# Patient Record
Sex: Female | Born: 1975
Health system: Southern US, Community
[De-identification: ages and names within clinical notes are randomized; demographics above are authoritative.]

## PROBLEM LIST (undated history)

## (undated) DIAGNOSIS — K219 Gastro-esophageal reflux disease without esophagitis: Secondary | ICD-10-CM

## (undated) DIAGNOSIS — R748 Abnormal levels of other serum enzymes: Secondary | ICD-10-CM

## (undated) DIAGNOSIS — M549 Dorsalgia, unspecified: Secondary | ICD-10-CM

## (undated) DIAGNOSIS — J9601 Acute respiratory failure with hypoxia: Secondary | ICD-10-CM

## (undated) DIAGNOSIS — D649 Anemia, unspecified: Secondary | ICD-10-CM

## (undated) DIAGNOSIS — F419 Anxiety disorder, unspecified: Secondary | ICD-10-CM

## (undated) DIAGNOSIS — E559 Vitamin D deficiency, unspecified: Secondary | ICD-10-CM

## (undated) HISTORY — DX: Dorsalgia, unspecified: M54.9

## (undated) HISTORY — DX: Abnormal levels of other serum enzymes: R74.8

## (undated) HISTORY — DX: Acute respiratory failure with hypoxia: J96.01

## (undated) HISTORY — DX: Vitamin D deficiency, unspecified: E55.9

## (undated) HISTORY — DX: Gastro-esophageal reflux disease without esophagitis: K21.9

## (undated) HISTORY — DX: Anemia, unspecified: D64.9

## (undated) HISTORY — PX: TONSILLECTOMY: SUR1361

## (undated) HISTORY — DX: Anxiety disorder, unspecified: F41.9

---

## 2002-02-18 ENCOUNTER — Encounter: Payer: Self-pay | Admitting: Emergency Medicine

## 2002-02-18 ENCOUNTER — Emergency Department (HOSPITAL_COMMUNITY): Admission: EM | Admit: 2002-02-18 | Discharge: 2002-02-18 | Payer: Self-pay | Admitting: Emergency Medicine

## 2002-06-18 ENCOUNTER — Other Ambulatory Visit: Admission: RE | Admit: 2002-06-18 | Discharge: 2002-06-18 | Payer: Self-pay | Admitting: *Deleted

## 2002-11-02 ENCOUNTER — Encounter (HOSPITAL_COMMUNITY): Admission: RE | Admit: 2002-11-02 | Discharge: 2002-11-02 | Payer: Self-pay | Admitting: *Deleted

## 2002-11-07 ENCOUNTER — Inpatient Hospital Stay (HOSPITAL_COMMUNITY): Admission: AD | Admit: 2002-11-07 | Discharge: 2002-11-12 | Payer: Self-pay | Admitting: *Deleted

## 2002-11-07 ENCOUNTER — Encounter: Payer: Self-pay | Admitting: *Deleted

## 2002-11-08 ENCOUNTER — Encounter: Payer: Self-pay | Admitting: *Deleted

## 2002-11-13 ENCOUNTER — Encounter: Admission: RE | Admit: 2002-11-13 | Discharge: 2002-12-13 | Payer: Self-pay | Admitting: *Deleted

## 2002-12-14 ENCOUNTER — Encounter: Admission: RE | Admit: 2002-12-14 | Discharge: 2003-01-13 | Payer: Self-pay | Admitting: *Deleted

## 2002-12-31 ENCOUNTER — Other Ambulatory Visit: Admission: RE | Admit: 2002-12-31 | Discharge: 2002-12-31 | Payer: Self-pay | Admitting: *Deleted

## 2005-02-11 ENCOUNTER — Emergency Department (HOSPITAL_COMMUNITY): Admission: EM | Admit: 2005-02-11 | Discharge: 2005-02-12 | Payer: Self-pay | Admitting: Emergency Medicine

## 2005-11-27 ENCOUNTER — Other Ambulatory Visit: Admission: RE | Admit: 2005-11-27 | Discharge: 2005-11-27 | Payer: Self-pay | Admitting: Gynecology

## 2005-12-16 ENCOUNTER — Emergency Department (HOSPITAL_COMMUNITY): Admission: EM | Admit: 2005-12-16 | Discharge: 2005-12-16 | Payer: Self-pay | Admitting: Family Medicine

## 2006-03-13 ENCOUNTER — Inpatient Hospital Stay (HOSPITAL_COMMUNITY): Admission: AD | Admit: 2006-03-13 | Discharge: 2006-03-16 | Payer: Self-pay | Admitting: Gynecology

## 2006-05-22 ENCOUNTER — Other Ambulatory Visit: Admission: RE | Admit: 2006-05-22 | Discharge: 2006-05-22 | Payer: Self-pay | Admitting: Gynecology

## 2012-09-22 ENCOUNTER — Other Ambulatory Visit: Payer: Self-pay | Admitting: Family Medicine

## 2012-09-24 NOTE — Telephone Encounter (Signed)
Opened in error

## 2014-03-22 DIAGNOSIS — H5210 Myopia, unspecified eye: Secondary | ICD-10-CM | POA: Insufficient documentation

## 2016-03-20 DIAGNOSIS — Z124 Encounter for screening for malignant neoplasm of cervix: Secondary | ICD-10-CM | POA: Diagnosis not present

## 2016-03-20 DIAGNOSIS — Z01419 Encounter for gynecological examination (general) (routine) without abnormal findings: Secondary | ICD-10-CM | POA: Diagnosis not present

## 2016-03-20 DIAGNOSIS — Z1151 Encounter for screening for human papillomavirus (HPV): Secondary | ICD-10-CM | POA: Diagnosis not present

## 2017-03-21 DIAGNOSIS — Z124 Encounter for screening for malignant neoplasm of cervix: Secondary | ICD-10-CM | POA: Diagnosis not present

## 2017-03-21 DIAGNOSIS — Z01419 Encounter for gynecological examination (general) (routine) without abnormal findings: Secondary | ICD-10-CM | POA: Diagnosis not present

## 2017-03-21 DIAGNOSIS — Z0000 Encounter for general adult medical examination without abnormal findings: Secondary | ICD-10-CM | POA: Diagnosis not present

## 2017-07-19 DIAGNOSIS — H5213 Myopia, bilateral: Secondary | ICD-10-CM | POA: Diagnosis not present

## 2017-11-27 ENCOUNTER — Encounter (INDEPENDENT_AMBULATORY_CARE_PROVIDER_SITE_OTHER): Payer: Self-pay

## 2017-12-03 ENCOUNTER — Encounter (INDEPENDENT_AMBULATORY_CARE_PROVIDER_SITE_OTHER): Payer: Self-pay | Admitting: Family Medicine

## 2017-12-03 ENCOUNTER — Ambulatory Visit (INDEPENDENT_AMBULATORY_CARE_PROVIDER_SITE_OTHER): Payer: 59 | Admitting: Family Medicine

## 2017-12-03 VITALS — BP 143/93 | HR 63 | Temp 98.1°F | Ht 65.0 in | Wt 242.0 lb

## 2017-12-03 DIAGNOSIS — Z6841 Body Mass Index (BMI) 40.0 and over, adult: Secondary | ICD-10-CM

## 2017-12-03 DIAGNOSIS — R5383 Other fatigue: Secondary | ICD-10-CM | POA: Diagnosis not present

## 2017-12-03 DIAGNOSIS — Z9189 Other specified personal risk factors, not elsewhere classified: Secondary | ICD-10-CM

## 2017-12-03 DIAGNOSIS — Z1331 Encounter for screening for depression: Secondary | ICD-10-CM

## 2017-12-03 DIAGNOSIS — R0602 Shortness of breath: Secondary | ICD-10-CM | POA: Insufficient documentation

## 2017-12-03 DIAGNOSIS — E559 Vitamin D deficiency, unspecified: Secondary | ICD-10-CM

## 2017-12-03 DIAGNOSIS — Z0289 Encounter for other administrative examinations: Secondary | ICD-10-CM

## 2017-12-03 DIAGNOSIS — R030 Elevated blood-pressure reading, without diagnosis of hypertension: Secondary | ICD-10-CM | POA: Diagnosis not present

## 2017-12-03 HISTORY — DX: Vitamin D deficiency, unspecified: E55.9

## 2017-12-03 NOTE — Progress Notes (Signed)
.  Office: [phone]  /  Fax: [phone]   HPI:   Chief Complaint: OBESITY  Julia Ibarra (MR# [id number]) is a 42 y.o. female who presents on 12/03/2017 for obesity evaluation and treatment. Current BMI is Body mass index is 40.27 kg/m.Marland Kitchen Shawnna has struggled with obesity for years and has been unsuccessful in either losing weight or maintaining long term weight loss. The wife of one of the director's at work, told Julia Ibarra about our program. Julia Ibarra doesn't feel she eats enough and she doesn't like traditional breakfast food. Julia Ibarra was previously on phentermine per [facility] notes in 2016. Julia Ibarra attended our information session and states she is currently in the action stage of change and ready to dedicate time achieving and maintaining a healthier weight.  Julia Ibarra states her family eats meals together she thinks her family will eat healthier with  her her desired weight loss is 72.3 lbs she has been heavy most of  her life she started gaining weight at 42 yrs old her heaviest weight ever was 249 lbs. she is a picky eater and doesn't like to eat healthier foods  she has significant food cravings issues  she snacks frequently in the evenings she skips meals frequently she is frequently drinking liquids with calories she has problems with excessive hunger  she frequently eats larger portions than normal  she struggles with emotional eating    Fatigue Julia Ibarra feels her energy is lower than it should be. This has worsened with weight gain and has not worsened recently. Julia Ibarra admits to daytime somnolence and admits to waking up still tired. Patient is at risk for obstructive sleep apnea. Patent has a history of symptoms of daytime fatigue, morning fatigue and morning headache. Patient generally gets 6 hours of sleep per night, and states they generally have restful sleep. Snoring is present. Apneic episodes are not present. Epworth Sleepiness Score is 5 Her EKG was within normal  limits.  Dyspnea on exertion Julia Ibarra notes increasing shortness of breath with exercising and seems to be worsening over time with weight gain. She notes getting out of breath sooner with activity than she used to. This has not gotten worse recently. Julia Ibarra wakes up tired, and she snores (snores loud). Julia Ibarra is to monitor the number of nighttime awakenings. She may need sleep study. Julia Ibarra denies orthopnea.  Vitamin D deficiency Julia Ibarra has a likely diagnosis of vitamin D deficiency given obesity. She is not currently taking vit D and denies nausea, vomiting or muscle weakness.  At risk for osteopenia and osteoporosis Julia Ibarra is at higher risk of osteopenia and osteoporosis due to vitamin D deficiency.   Elevated Blood Pressure Julia Ibarra has elevated blood pressure today at 143/93 without diagnosis of hypertension. She denies chest pain, chest pressure or headache.  Depression Screen Julia Ibarra's Food and Mood (modified PHQ-9) score was  Depression screen PHQ 2/9 12/03/2017  Decreased Interest 1  Down, Depressed, Hopeless 1  PHQ - 2 Score 2  Altered sleeping 0  Tired, decreased energy 3  Change in appetite 1  Feeling bad or failure about yourself  1  Trouble concentrating 0  Moving slowly or fidgety/restless 0  Suicidal thoughts 0  PHQ-9 Score 7  Difficult doing work/chores Not difficult at all    ALLERGIES: Not on File  MEDICATIONS: Current Outpatient Medications on File Prior to Visit  Medication Sig Dispense Refill  . levonorgestrel-ethinyl estradiol (NORDETTE) 0.15-30 MG-MCG tablet Take 1 tablet by mouth daily.     No current facility-administered medications on  file prior to visit.     PAST MEDICAL HISTORY: History reviewed. No pertinent past medical history.  PAST SURGICAL HISTORY: Past Surgical History:  Procedure Laterality Date  . TONSILLECTOMY      SOCIAL HISTORY: Social History   Tobacco Use  . Smoking status: Never Smoker  . Smokeless tobacco: Never  Used  Substance Use Topics  . Alcohol use: Yes    Alcohol/week: 0.6 - 1.2 oz    Types: 1 - 2 Glasses of wine per week    Comment: On the weekends  . Drug use: No    FAMILY HISTORY: Family History  Problem Relation Age of Onset  . Depression Mother   . Anxiety disorder Mother   . Bipolar disorder Mother   . Alcoholism Mother     ROS: Review of Systems  Constitutional: Positive for malaise/fatigue.  Eyes:       Wear Glasses or Contacts  Respiratory: Positive for shortness of breath (on exertion).   Cardiovascular: Negative for chest pain and orthopnea.       Negative for chest pressure  Gastrointestinal: Negative for nausea and vomiting.  Musculoskeletal: Positive for back pain.       Negative muscle weakness  Neurological: Negative for headaches.    PHYSICAL EXAM: Blood pressure (!) 143/93, pulse 63, temperature 98.1 F (36.7 C), temperature source Oral, height 5\' 5"  (1.651 m), weight 242 lb (109.8 kg), last menstrual period 11/22/2017, SpO2 100 %. Body mass index is 40.27 kg/m. Physical Exam  Constitutional: She is oriented to person, place, and time. She appears well-developed and well-nourished.  HENT:  Head: Normocephalic and atraumatic.  Nose: Nose normal.  Eyes: EOM are normal. No scleral icterus.  Neck: Normal range of motion. Neck supple. No thyromegaly present.  Cardiovascular: Normal rate and regular rhythm.  Pulmonary/Chest: Effort normal. No respiratory distress.  Abdominal: Soft. There is no tenderness.  + obesity  Musculoskeletal: Normal range of motion.  Range of Motion normal in all 4 extremities  Neurological: She is alert and oriented to person, place, and time. Coordination normal.  Skin: Skin is warm and dry.  Psychiatric: She has a normal mood and affect. Her behavior is normal.  Vitals reviewed.   RECENT LABS AND TESTS: BMET No results found for: NA, K, CL, CO2, GLUCOSE, BUN, CREATININE, CALCIUM, GFRNONAA, GFRAA No results found for:  HGBA1C No results found for: INSULIN CBC No results found for: WBC, RBC, HGB, HCT, PLT, MCV, MCH, MCHC, RDW, LYMPHSABS, MONOABS, EOSABS, BASOSABS Iron/TIBC/Ferritin/ %Sat No results found for: IRON, TIBC, FERRITIN, IRONPCTSAT Lipid Panel  No results found for: CHOL, TRIG, HDL, CHOLHDL, VLDL, LDLCALC, LDLDIRECT Hepatic Function Panel  No results found for: PROT, ALBUMIN, AST, ALT, ALKPHOS, BILITOT, BILIDIR, IBILI No results found for: TSH Vitamin D There are no recent results  ECG  shows NSR with a rate of 62 BPM INDIRECT CALORIMETER done today shows a VO2 of 325 and a REE of 2263. Her calculated basal metabolic rate is 4098 thus her basal metabolic rate is better than expected.    ASSESSMENT AND PLAN: Other fatigue - Plan: EKG 12-Lead, Vitamin B12, CBC With Differential, Comprehensive metabolic panel, Folate, Hemoglobin A1c, Insulin, random, Lipid Panel With LDL/HDL Ratio, T3, T4, free, TSH  Shortness of breath on exertion - Plan: CBC With Differential  Elevated blood pressure reading - Plan: Comprehensive metabolic panel  Vitamin D deficiency - Plan: VITAMIN D 25 Hydroxy (Vit-D Deficiency, Fractures)  Depression screening  At risk for osteoporosis  Class 3  severe obesity with serious comorbidity and body mass index (BMI) of 40.0 to 44.9 in adult, unspecified obesity type Julia Ibarra)  PLAN:  Fatigue Julia Ibarra was informed that her fatigue may be related to obesity, depression or many other causes. Labs will be ordered, and in the meanwhile Julia Ibarra has agreed to work on diet, exercise and weight loss to help with fatigue. Proper sleep hygiene was discussed including the need for 7-8 hours of quality sleep each night. A sleep study was not ordered based on symptoms and Epworth score. We will order EKG and indirect calorimetry and Julia Ibarra agrees to follow up with our clinic in 2 weeks.  Dyspnea on exertion Julia Ibarra's shortness of breath appears to be obesity related and exercise  induced. She has agreed to work on weight loss and gradually increase exercise to treat her exercise induced shortness of breath. If Julia Ibarra follows our instructions and loses weight without improvement of her shortness of breath, we will plan to refer to pulmonology. We will order EKG and indirect calorimetry and will monitor this condition regularly. Julia Ibarra agrees to this plan.  Vitamin D Deficiency Julia Ibarra was informed that low vitamin D levels contributes to fatigue and are associated with obesity, breast, and colon cancer. We will check labs and will follow up for routine testing of vitamin D, at least 2-3 times per year.   At risk for osteopenia and osteoporosis Julia Ibarra is at risk for osteopenia and osteoporosis due to her vitamin D deficiency. She was encouraged to follow her higher calcium diet and increase strengthening exercise to help strengthen her bones and decrease her risk of osteopenia and osteoporosis.  Elevated Blood Pressure We will monitor blood pressure at her next visit in 2 weeks.  Depression Screen Julia Ibarra had a mildly positive depression screening. Depression is commonly associated with obesity and often results in emotional eating behaviors. We will monitor this closely and work on CBT to help improve the non-hunger eating patterns. Referral to Psychology may be required if no improvement is seen as she continues in our clinic.  Obesity Julia Ibarra is currently in the action stage of change and her goal is to continue with weight loss efforts She has agreed to follow the Category 3 plan +200 calories Julia Ibarra has been instructed to work up to a goal of 150 minutes of combined cardio and strengthening exercise per week for weight loss and overall health benefits. We discussed the following Behavioral Modification Strategies today: increase H2O intake, increasing lean protein intake, decrease eating out and dealing with family or coworker sabotage  Lucrezia has agreed to  follow up with our clinic in 2 weeks. She was informed of the importance of frequent follow up visits to maximize her success with intensive lifestyle modifications for her multiple health conditions. She was informed we would discuss her lab results at her next visit unless there is a critical issue that needs to be addressed sooner. Sanaya agreed to keep her next visit at the agreed upon time to discuss these results.    OBESITY BEHAVIORAL INTERVENTION VISIT  Today's visit was # 1 out of 22.  Starting weight: 242 lbs Starting date: 12/03/17 Today's weight : 242 lbs Today's date: 12/03/2017 Total lbs lost to date: 0 (Patients must lose 7 lbs in the first 6 months to continue with counseling)   ASK: We discussed the diagnosis of obesity with Wonda Olds today and Maybelle agreed to give [place] permission to discuss obesity behavioral modification therapy today.  ASSESS: Novie has the  diagnosis of obesity and her BMI today is 40.27 [place] is in the action stage of change   ADVISE: Dalores was educated on the multiple health risks of obesity as well as the benefit of weight loss to improve her health. She was advised of the need for long term treatment and the importance of lifestyle modifications.  AGREE: Multiple dietary modification options and treatment options were discussed and  Bobbette agreed to the above obesity treatment plan.   I, Doreene Nest, am acting as transcriptionist for Eber Jones, MD   I have reviewed the above documentation for accuracy and completeness, and I agree with the above. - Ilene Qua, MD

## 2017-12-04 LAB — CBC WITH DIFFERENTIAL
Basophils Absolute: 0 10*3/uL (ref 0.0–0.2)
Basos: 0 %
EOS (ABSOLUTE): 0.1 10*3/uL (ref 0.0–0.4)
EOS: 2 %
HEMATOCRIT: 37.3 % (ref 34.0–46.6)
HEMOGLOBIN: 11.7 g/dL (ref 11.1–15.9)
IMMATURE GRANS (ABS): 0 10*3/uL (ref 0.0–0.1)
IMMATURE GRANULOCYTES: 0 %
LYMPHS ABS: 2.4 10*3/uL (ref 0.7–3.1)
LYMPHS: 37 %
MCH: 28.1 pg (ref 26.6–33.0)
MCHC: 31.4 g/dL — ABNORMAL LOW (ref 31.5–35.7)
MCV: 90 fL (ref 79–97)
MONOCYTES: 6 %
Monocytes Absolute: 0.4 10*3/uL (ref 0.1–0.9)
Neutrophils Absolute: 3.7 10*3/uL (ref 1.4–7.0)
Neutrophils: 55 %
RBC: 4.16 x10E6/uL (ref 3.77–5.28)
RDW: 13.9 % (ref 12.3–15.4)
WBC: 6.7 10*3/uL (ref 3.4–10.8)

## 2017-12-04 LAB — VITAMIN D 25 HYDROXY (VIT D DEFICIENCY, FRACTURES): Vit D, 25-Hydroxy: 5.8 ng/mL — ABNORMAL LOW (ref 30.0–100.0)

## 2017-12-04 LAB — LIPID PANEL WITH LDL/HDL RATIO
CHOLESTEROL TOTAL: 166 mg/dL (ref 100–199)
HDL: 73 mg/dL (ref >39)
LDL CALC: 78 mg/dL (ref 0–99)
LDl/HDL Ratio: 1.1 ratio (ref 0.0–3.2)
TRIGLYCERIDES: 75 mg/dL (ref 0–149)
VLDL CHOLESTEROL CAL: 15 mg/dL (ref 5–40)

## 2017-12-04 LAB — COMPREHENSIVE METABOLIC PANEL
ALBUMIN: 4.2 g/dL (ref 3.5–5.5)
ALT: 7 IU/L (ref 0–32)
AST: 9 IU/L (ref 0–40)
Albumin/Globulin Ratio: 1.7 (ref 1.2–2.2)
Alkaline Phosphatase: 58 IU/L (ref 39–117)
BUN/Creatinine Ratio: 13 (ref 9–23)
BUN: 10 mg/dL (ref 6–20)
Bilirubin Total: 0.3 mg/dL (ref 0.0–1.2)
CALCIUM: 9.6 mg/dL (ref 8.7–10.2)
CO2: 21 mmol/L (ref 20–29)
CREATININE: 0.77 mg/dL (ref 0.57–1.00)
Chloride: 107 mmol/L — ABNORMAL HIGH (ref 96–106)
GFR calc Af Amer: 112 mL/min/{1.73_m2} (ref >59)
GFR, EST NON AFRICAN AMERICAN: 98 mL/min/{1.73_m2} (ref >59)
GLOBULIN, TOTAL: 2.5 g/dL (ref 1.5–4.5)
Glucose: 98 mg/dL (ref 65–99)
Potassium: 4.2 mmol/L (ref 3.5–5.2)
SODIUM: 140 mmol/L (ref 134–144)
TOTAL PROTEIN: 6.7 g/dL (ref 6.0–8.5)

## 2017-12-04 LAB — TSH: TSH: 1.53 u[IU]/mL (ref 0.450–4.500)

## 2017-12-04 LAB — VITAMIN B12

## 2017-12-04 LAB — HEMOGLOBIN A1C
Est. average glucose Bld gHb Est-mCnc: 103 mg/dL
Hgb A1c MFr Bld: 5.2 % (ref 4.8–5.6)

## 2017-12-04 LAB — INSULIN, RANDOM: INSULIN: 7.6 u[IU]/mL (ref 2.6–24.9)

## 2017-12-04 LAB — T3: T3, Total: 112 ng/dL (ref 71–180)

## 2017-12-04 LAB — T4, FREE: Free T4: 1.21 ng/dL (ref 0.82–1.77)

## 2017-12-04 LAB — FOLATE: Folate: 7 ng/mL (ref >3.0)

## 2017-12-17 ENCOUNTER — Ambulatory Visit (INDEPENDENT_AMBULATORY_CARE_PROVIDER_SITE_OTHER): Payer: 59 | Admitting: Family Medicine

## 2017-12-17 VITALS — BP 124/84 | HR 66 | Temp 98.0°F | Ht 65.0 in | Wt 238.0 lb

## 2017-12-17 DIAGNOSIS — E538 Deficiency of other specified B group vitamins: Secondary | ICD-10-CM | POA: Diagnosis not present

## 2017-12-17 DIAGNOSIS — Z9189 Other specified personal risk factors, not elsewhere classified: Secondary | ICD-10-CM | POA: Diagnosis not present

## 2017-12-17 DIAGNOSIS — Z6839 Body mass index (BMI) 39.0-39.9, adult: Secondary | ICD-10-CM | POA: Diagnosis not present

## 2017-12-17 DIAGNOSIS — E559 Vitamin D deficiency, unspecified: Secondary | ICD-10-CM

## 2017-12-17 MED ORDER — VITAMIN D (ERGOCALCIFEROL) 1.25 MG (50000 UNIT) PO CAPS: 50000.0000 [IU] | ORAL_CAPSULE | ORAL | 0 refills | Status: DC

## 2017-12-17 MED ORDER — VITAMIN B-12 1000 MCG PO TABS: 2000.0000 ug | ORAL_TABLET | Freq: Every day | ORAL | 0 refills | Status: DC

## 2017-12-17 NOTE — Progress Notes (Signed)
Office: [phone]  /  Fax: [phone]   HPI:   Chief Complaint: OBESITY Julia Ibarra is here to discuss her progress with her obesity treatment plan. She is on the Category 3 plan + 200 calories and is following her eating plan approximately 95 % of the time. She states she is doing cardio and strength for 60 minutes 3-4 times per week. Julia Ibarra brought lunch except a few days, able to eat most of meal plan including protein at dinner. She finds bread to not taste like much and was not getting all snack calories in.  Her weight is 238 lb (108 kg) today and has had a weight loss of 4 pounds over a period of 2 weeks since her last visit. She has lost 4 lbs since starting treatment with [place].  Vitamin D Deficiency Julia Ibarra has a diagnosis of vitamin D deficiency. She is not on any OTC supplement currently and she denies nausea, vomiting or muscle weakness.  Vitamin B12 Deficiency Julia Ibarra has a diagnosis of B12 insufficiency. She has never tested before, B12 <150 but folate normal. She is not a vegetarian and does not have a previous diagnosis of pernicious anemia. She does not have a history of weight loss surgery.   ALLERGIES: Not on File  MEDICATIONS: Current Outpatient Medications on File Prior to Visit  Medication Sig Dispense Refill  . levonorgestrel-ethinyl estradiol (NORDETTE) 0.15-30 MG-MCG tablet Take 1 tablet by mouth daily.     No current facility-administered medications on file prior to visit.     PAST MEDICAL HISTORY: No past medical history on file.  PAST SURGICAL HISTORY: Past Surgical History:  Procedure Laterality Date  . TONSILLECTOMY      SOCIAL HISTORY: Social History   Tobacco Use  . Smoking status: Never Smoker  . Smokeless tobacco: Never Used  Substance Use Topics  . Alcohol use: Yes    Alcohol/week: 0.6 - 1.2 oz    Types: 1 - 2 Glasses of wine per week    Comment: On the weekends  . Drug use: No    FAMILY HISTORY: Family History  Problem  Relation Age of Onset  . Depression Mother   . Anxiety disorder Mother   . Bipolar disorder Mother   . Alcoholism Mother     ROS: Review of Systems  Constitutional: Positive for weight loss.  Gastrointestinal: Negative for nausea and vomiting.  Musculoskeletal:       Negative muscle weakness    PHYSICAL EXAM: Blood pressure 124/84, pulse 66, temperature 98 F (36.7 C), temperature source Oral, height 5\' 5"  (1.651 m), weight 238 lb (108 kg), last menstrual period 11/22/2017, SpO2 100 %. Body mass index is 39.61 kg/m. Physical Exam  Constitutional: She is oriented to person, place, and time. She appears well-developed and well-nourished.  Cardiovascular: Normal rate.  Pulmonary/Chest: Effort normal.  Musculoskeletal: Normal range of motion.  Neurological: She is oriented to person, place, and time.  Skin: Skin is warm and dry.  Psychiatric: She has a normal mood and affect. Her behavior is normal.  Vitals reviewed.   RECENT LABS AND TESTS: BMET    Component Value Date/Time   NA 140 12/03/2017 1009   K 4.2 12/03/2017 1009   CL 107 (H) 12/03/2017 1009   CO2 21 12/03/2017 1009   GLUCOSE 98 12/03/2017 1009   BUN 10 12/03/2017 1009   CREATININE 0.77 12/03/2017 1009   CALCIUM 9.6 12/03/2017 1009   GFRNONAA 98 12/03/2017 1009   GFRAA 112 12/03/2017 1009  Lab Results  Component Value Date   HGBA1C 5.2 12/03/2017   Lab Results  Component Value Date   INSULIN 7.6 12/03/2017   CBC    Component Value Date/Time   WBC 6.7 12/03/2017 1009   RBC 4.16 12/03/2017 1009   HGB 11.7 12/03/2017 1009   HCT 37.3 12/03/2017 1009   MCV 90 12/03/2017 1009   MCH 28.1 12/03/2017 1009   MCHC 31.4 (L) 12/03/2017 1009   RDW 13.9 12/03/2017 1009   LYMPHSABS 2.4 12/03/2017 1009   EOSABS 0.1 12/03/2017 1009   BASOSABS 0.0 12/03/2017 1009   Iron/TIBC/Ferritin/ %Sat No results found for: IRON, TIBC, FERRITIN, IRONPCTSAT Lipid Panel     Component Value Date/Time   CHOL 166  12/03/2017 1009   TRIG 75 12/03/2017 1009   HDL 73 12/03/2017 1009   LDLCALC 78 12/03/2017 1009   Hepatic Function Panel     Component Value Date/Time   PROT 6.7 12/03/2017 1009   ALBUMIN 4.2 12/03/2017 1009   AST 9 12/03/2017 1009   ALT 7 12/03/2017 1009   ALKPHOS 58 12/03/2017 1009   BILITOT 0.3 12/03/2017 1009      Component Value Date/Time   TSH 1.530 12/03/2017 1009  Results for RUTA, CAPECE (MRN [id number]) as of 12/17/2017 14:28  Ref. Range 12/03/2017 10:09  Vitamin D, 25-Hydroxy Latest Ref Range: 30.0 - 100.0 ng/mL 5.8 (L)    ASSESSMENT AND PLAN: Vitamin D deficiency - Plan: Vitamin D, Ergocalciferol, (DRISDOL) 50000 units CAPS capsule  Vitamin B12 deficiency - Plan: vitamin B-12 (CYANOCOBALAMIN) 1000 MCG tablet  At risk for osteopenia  Class 2 severe obesity with serious comorbidity and body mass index (BMI) of 39.0 to 39.9 in adult, unspecified obesity type (HCC)  PLAN:  Vitamin D Deficiency Julia Ibarra was informed that low vitamin D levels contributes to fatigue and are associated with obesity, breast, and colon cancer. Julia Ibarra agrees to start prescription Vit D @50 ,000 IU every week #4 with no refills and will follow up for routine testing of vitamin D, at least 2-3 times per year. She was informed of the risk of over-replacement of vitamin D and agrees to not increase her dose unless she discusses this with [place] first. Julia Ibarra agrees to follow up with our clinic in 2 weeks.  Vitamin B12 Deficiency Julia Ibarra will work on increasing B12 rich foods in her diet. Julia Ibarra agrees to start Vit B12 2,000 PO daily with no refills. We will recheck labs in 3 months and Julia Ibarra agrees to follow up with our clinic in 2 weeks.   At risk for osteopenia and osteoporosis Julia Ibarra is at risk for osteopenia and osteoporsis due to her vitamin D deficiency. She was encouraged to take her vitamin D and follow her higher calcium diet and increase strengthening exercise to help strengthen her  bones and decrease her risk of osteopenia and osteoporosis.  Obesity Julia Ibarra is currently in the action stage of change. As such, her goal is to continue with weight loss efforts She has agreed to follow the Category 3 plan + 200 calories Katora has been instructed to work up to a goal of 150 minutes of combined cardio and strengthening exercise per week for weight loss and overall health benefits. We discussed the following Behavioral Modification Strategies today: increasing lean protein intake, work on meal planning and easy cooking plans, increase H20 intake, and planning for success   Julia Ibarra has agreed to follow up with our clinic in 2 weeks. She was informed of the importance of  frequent follow up visits to maximize her success with intensive lifestyle modifications for her multiple health conditions.   OBESITY BEHAVIORAL INTERVENTION VISIT  Today's visit was # 2 out of 22.  Starting weight: 242 lbs Starting date: 12/03/17 Today's weight : 238 lbs  Today's date: 12/17/2017 Total lbs lost to date: 4 (Patients must lose 7 lbs in the first 6 months to continue with counseling)   ASK: We discussed the diagnosis of obesity with Julia Ibarra today and Julia Ibarra agreed to give [place] permission to discuss obesity behavioral modification therapy today.  ASSESS: Julia Ibarra has the diagnosis of obesity and her BMI today is 39.61 Julia Ibarra is in the action stage of change   ADVISE: Julia Ibarra was educated on the multiple health risks of obesity as well as the benefit of weight loss to improve her health. She was advised of the need for long term treatment and the importance of lifestyle modifications.  AGREE: Multiple dietary modification options and treatment options were discussed and  Julia Ibarra agreed to the above obesity treatment plan.  I, Trixie Dredge, am acting as transcriptionist for Ilene Qua, MD  I have reviewed the above documentation for accuracy and completeness, and I  agree with the above. - Ilene Qua, MD

## 2017-12-31 ENCOUNTER — Ambulatory Visit (INDEPENDENT_AMBULATORY_CARE_PROVIDER_SITE_OTHER): Payer: 59 | Admitting: Family Medicine

## 2017-12-31 VITALS — BP 123/78 | HR 74 | Temp 98.4°F | Ht 65.0 in | Wt 238.0 lb

## 2017-12-31 DIAGNOSIS — E538 Deficiency of other specified B group vitamins: Secondary | ICD-10-CM

## 2017-12-31 DIAGNOSIS — E559 Vitamin D deficiency, unspecified: Secondary | ICD-10-CM

## 2017-12-31 DIAGNOSIS — Z6839 Body mass index (BMI) 39.0-39.9, adult: Secondary | ICD-10-CM | POA: Diagnosis not present

## 2017-12-31 NOTE — Progress Notes (Signed)
Office: [phone]  /  Fax: [phone]   HPI:   Chief Complaint: OBESITY Julia Ibarra is here to discuss her progress with her obesity treatment plan. She is on the Category 3 plan +200 calories and is following her eating plan approximately 95 % of the time. She states she is doing cardio and strength training 30 to 60 minutes 3 times per week. Julia Ibarra is getting bored with meals on the category 3 plan and is looking for more options. Her weight is 238 lb (108 kg) today and has maintained weight over a period of 2 weeks since her last visit. She has lost 4 lbs since starting treatment with [place].  Vitamin D deficiency Julia Ibarra has a diagnosis of vitamin D deficiency. She is currently taking vit D and admits fatigue. Julia Ibarra denies nausea, vomiting or muscle weakness.   Ref. Range 12/03/2017 10:09  Vitamin D, 25-Hydroxy Latest Ref Range: 30.0 - 100.0 ng/mL 5.8 (L)   Vitamin B12 Deficiency Julia Ibarra has a diagnosis of B12 insufficiency and notes fatigue. This is not a new diagnosis. Julia Ibarra is currently taking OTC Vit B12 supplement. Julia Ibarra is not a vegetarian and does not have a previous diagnosis of pernicious anemia. She does not have a history of weight loss surgery.   ALLERGIES: Not on File  MEDICATIONS: Current Outpatient Medications on File Prior to Visit  Medication Sig Dispense Refill  . levonorgestrel-ethinyl estradiol (NORDETTE) 0.15-30 MG-MCG tablet Take 1 tablet by mouth daily.    . vitamin B-12 (CYANOCOBALAMIN) 1000 MCG tablet Take 2 tablets (2,000 mcg total) by mouth daily. 60 tablet 0  . Vitamin D, Ergocalciferol, (DRISDOL) 50000 units CAPS capsule Take 1 capsule (50,000 Units total) by mouth every 7 (seven) days. 4 capsule 0   No current facility-administered medications on file prior to visit.     PAST MEDICAL HISTORY: No past medical history on file.  PAST SURGICAL HISTORY: Past Surgical History:  Procedure Laterality Date  . TONSILLECTOMY      SOCIAL  HISTORY: Social History   Tobacco Use  . Smoking status: Never Smoker  . Smokeless tobacco: Never Used  Substance Use Topics  . Alcohol use: Yes    Alcohol/week: 0.6 - 1.2 oz    Types: 1 - 2 Glasses of wine per week    Comment: On the weekends  . Drug use: No    FAMILY HISTORY: Family History  Problem Relation Age of Onset  . Depression Mother   . Anxiety disorder Mother   . Bipolar disorder Mother   . Alcoholism Mother     ROS: Review of Systems  Constitutional: Positive for malaise/fatigue. Negative for weight loss.  Gastrointestinal: Negative for nausea and vomiting.  Musculoskeletal:       Negative for muscle weakness    PHYSICAL EXAM: Blood pressure 123/78, pulse 74, temperature 98.4 F (36.9 C), temperature source Oral, height 5\' 5"  (1.651 m), weight 238 lb (108 kg), SpO2 99 %. Body mass index is 39.61 kg/m. Physical Exam  Constitutional: She is oriented to person, place, and time. She appears well-developed and well-nourished.  Cardiovascular: Normal rate.  Pulmonary/Chest: Effort normal.  Musculoskeletal: Normal range of motion.  Neurological: She is oriented to person, place, and time.  Skin: Skin is warm and dry.  Psychiatric: She has a normal mood and affect. Her behavior is normal.  Vitals reviewed.   RECENT LABS AND TESTS: BMET    Component Value Date/Time   NA 140 12/03/2017 1009   K 4.2 12/03/2017 1009  CL 107 (H) 12/03/2017 1009   CO2 21 12/03/2017 1009   GLUCOSE 98 12/03/2017 1009   BUN 10 12/03/2017 1009   CREATININE 0.77 12/03/2017 1009   CALCIUM 9.6 12/03/2017 1009   GFRNONAA 98 12/03/2017 1009   GFRAA 112 12/03/2017 1009   Lab Results  Component Value Date   HGBA1C 5.2 12/03/2017   Lab Results  Component Value Date   INSULIN 7.6 12/03/2017   CBC    Component Value Date/Time   WBC 6.7 12/03/2017 1009   RBC 4.16 12/03/2017 1009   HGB 11.7 12/03/2017 1009   HCT 37.3 12/03/2017 1009   MCV 90 12/03/2017 1009   MCH 28.1  12/03/2017 1009   MCHC 31.4 (L) 12/03/2017 1009   RDW 13.9 12/03/2017 1009   LYMPHSABS 2.4 12/03/2017 1009   EOSABS 0.1 12/03/2017 1009   BASOSABS 0.0 12/03/2017 1009   Iron/TIBC/Ferritin/ %Sat No results found for: IRON, TIBC, FERRITIN, IRONPCTSAT Lipid Panel     Component Value Date/Time   CHOL 166 12/03/2017 1009   TRIG 75 12/03/2017 1009   HDL 73 12/03/2017 1009   LDLCALC 78 12/03/2017 1009   Hepatic Function Panel     Component Value Date/Time   PROT 6.7 12/03/2017 1009   ALBUMIN 4.2 12/03/2017 1009   AST 9 12/03/2017 1009   ALT 7 12/03/2017 1009   ALKPHOS 58 12/03/2017 1009   BILITOT 0.3 12/03/2017 1009      Component Value Date/Time   TSH 1.530 12/03/2017 1009    Ref. Range 12/03/2017 10:09  Vitamin D, 25-Hydroxy Latest Ref Range: 30.0 - 100.0 ng/mL 5.8 (L)   ASSESSMENT AND PLAN: Vitamin D deficiency  Vitamin B12 deficiency  Class 2 severe obesity with serious comorbidity and body mass index (BMI) of 39.0 to 39.9 in adult, unspecified obesity type (HCC)  PLAN:  Vitamin D Deficiency Julia Ibarra was informed that low vitamin D levels contributes to fatigue and are associated with obesity, breast, and colon cancer. She agrees to continue to take prescription Vit D @50 ,000 IU every week and will follow up for routine testing of vitamin D, at least 2-3 times per year. She was informed of the risk of over-replacement of vitamin D and agrees to not increase her dose unless she discusses this with [place] first.  Vitamin B12 Deficiency Julia Ibarra will work on increasing B12 rich foods in her diet. Julia Ibarra will continue to take OTC B12 supplementation  We will plan on rechecking labs in the next 2 1/2 months.  We spent > than 50% of the 15 minute visit on the counseling as documented in the note.  Obesity Julia Ibarra is currently in the action stage of change. As such, her goal is to continue with weight loss efforts She has agreed to keep a food journal with 300 to 400 calories  and 30 grams of protein at lunch daily and follow the Category 3 plan Julia Ibarra has been instructed to work up to a goal of 150 minutes of combined cardio and strengthening exercise per week for weight loss and overall health benefits. We discussed the following Behavioral Modification Strategies today: increase H2O intake, no skipping meals, keep a astrict food journal, increasing lean protein intake and work on meal planning and easy cooking plans   Julia Ibarra has agreed to follow up with our clinic in 2 weeks. She was informed of the importance of frequent follow up visits to maximize her success with intensive lifestyle modifications for her multiple health conditions.   OBESITY BEHAVIORAL INTERVENTION  VISIT  Today's visit was # 3 out of 22.  Starting weight: 242 lbs Starting date: 12/03/17 Today's weight : 238 lbs Today's date: 12/31/2017 Total lbs lost to date: 4 (Patients must lose 7 lbs in the first 6 months to continue with counseling)   ASK: We discussed the diagnosis of obesity with Julia Ibarra today and Julia Ibarra agreed to give [place] permission to discuss obesity behavioral modification therapy today.  ASSESS: Julia Ibarra has the diagnosis of obesity and her BMI today is 39.61 Julia Ibarra is in the action stage of change   ADVISE: Julia Ibarra was educated on the multiple health risks of obesity as well as the benefit of weight loss to improve her health. She was advised of the need for long term treatment and the importance of lifestyle modifications.  AGREE: Multiple dietary modification options and treatment options were discussed and  Julia Ibarra agreed to the above obesity treatment plan.  I, Doreene Nest, am acting as transcriptionist for Eber Jones, MD  I have reviewed the above documentation for accuracy and completeness, and I agree with the above. - Ilene Qua, MD

## 2018-01-14 ENCOUNTER — Ambulatory Visit (INDEPENDENT_AMBULATORY_CARE_PROVIDER_SITE_OTHER): Payer: 59 | Admitting: Family Medicine

## 2018-01-14 ENCOUNTER — Encounter (INDEPENDENT_AMBULATORY_CARE_PROVIDER_SITE_OTHER): Payer: Self-pay

## 2018-03-05 ENCOUNTER — Ambulatory Visit (INDEPENDENT_AMBULATORY_CARE_PROVIDER_SITE_OTHER): Payer: 59 | Admitting: Nurse Practitioner

## 2018-03-05 ENCOUNTER — Encounter: Payer: Self-pay | Admitting: Nurse Practitioner

## 2018-03-05 VITALS — BP 124/80 | HR 73 | Temp 98.1°F | Ht 65.0 in | Wt 247.0 lb

## 2018-03-05 DIAGNOSIS — E559 Vitamin D deficiency, unspecified: Secondary | ICD-10-CM | POA: Diagnosis not present

## 2018-03-05 DIAGNOSIS — Z136 Encounter for screening for cardiovascular disorders: Secondary | ICD-10-CM

## 2018-03-05 DIAGNOSIS — Z6841 Body Mass Index (BMI) 40.0 and over, adult: Secondary | ICD-10-CM

## 2018-03-05 DIAGNOSIS — Z13220 Encounter for screening for lipoid disorders: Secondary | ICD-10-CM

## 2018-03-05 DIAGNOSIS — Z713 Dietary counseling and surveillance: Secondary | ICD-10-CM

## 2018-03-05 MED ORDER — PHENTERMINE HCL 37.5 MG PO TABS
37.5000 mg | ORAL_TABLET | Freq: Every day | ORAL | 1 refills | Status: DC
Start: 2018-03-05 — End: 2018-05-01

## 2018-03-05 NOTE — Progress Notes (Addendum)
Subjective:  Patient ID: Julia Ibarra, female    DOB: 1975/12/04  Age: 42 y.o. MRN: [id number]  CC: Establish Care (est care/weight loss consult/was on weight loss program--meal plan,exercise 3x wk/gain more weight/was on phentamine/refill for Vit D??)   HPI   Obesity: Started with weight loss clinic 11/2017. No significant weight loss with meal plan and exercise 3x/week. Has used phentermine past (37yrs ago), lost 15-20Lbs with medication.  she will like take medication again She plans to maintain appts with nutritionist at weight loss clinic. Wt Readings from Last 3 Encounters:  03/05/18 247 lb (112 kg)  12/31/17 238 lb (108 kg)  12/17/17 238 lb (108 kg)   Vitamin Deficiency: Took vitamin D supplement 50,000IU for 79months.need re eval. Last checked 11/2017: 5.8  GYN: Dr. Posey Pronto with [facility]. Use of OCP at this time.  Outpatient Medications Prior to Visit  Medication Sig Dispense Refill  . levonorgestrel-ethinyl estradiol (NORDETTE) 0.15-30 MG-MCG tablet Take 1 tablet by mouth daily.    . vitamin B-12 (CYANOCOBALAMIN) 1000 MCG tablet Take 2 tablets (2,000 mcg total) by mouth daily. 60 tablet 0  . Vitamin D, Ergocalciferol, (DRISDOL) 50000 units CAPS capsule Take 1 capsule (50,000 Units total) by mouth every 7 (seven) days. 4 capsule 0   No facility-administered medications prior to visit.     ROS See HPI  Objective:  BP 124/80   Pulse 73   Temp 98.1 F (36.7 C) (Oral)   Ht 5\' 5"  (1.651 m)   Wt 247 lb (112 kg)   LMP 02/14/2018 (Exact Date)   SpO2 100%   BMI 41.10 kg/m   BP Readings from Last 3 Encounters:  03/05/18 124/80  12/31/17 123/78  12/17/17 124/84    Wt Readings from Last 3 Encounters:  03/05/18 247 lb (112 kg)  12/31/17 238 lb (108 kg)  12/17/17 238 lb (108 kg)    Physical Exam  Constitutional: She is oriented to person, place, and time. No distress.  Neck: No JVD present.  Cardiovascular: Normal rate, regular rhythm and normal heart sounds.    Pulmonary/Chest: Effort normal and breath sounds normal.  Abdominal: Soft. Bowel sounds are normal.  Musculoskeletal: She exhibits no edema.  Neurological: She is alert and oriented to person, place, and time.  Psychiatric: She has a normal mood and affect. Her behavior is normal. Thought content normal.  Vitals reviewed.  Lab Results  Component Value Date   WBC 6.7 12/03/2017   HGB 11.7 12/03/2017   HCT 37.3 12/03/2017   GLUCOSE 98 12/03/2017   CHOL 165 03/06/2018   TRIG 60.0 03/06/2018   HDL 81.80 03/06/2018   LDLCALC 71 03/06/2018   ALT 7 12/03/2017   AST 9 12/03/2017   NA 140 12/03/2017   K 4.2 12/03/2017   CL 107 (H) 12/03/2017   CREATININE 0.77 12/03/2017   BUN 10 12/03/2017   CO2 21 12/03/2017   TSH 1.530 12/03/2017   HGBA1C 5.2 12/03/2017    Assessment & Plan:   Julia Ibarra was seen today for establish care.  Diagnoses and all orders for this visit:  Encounter for weight loss counseling -     phentermine (ADIPEX-P) 37.5 MG tablet; Take 1 tablet (37.5 mg total) by mouth daily before breakfast.  Class 3 severe obesity without serious comorbidity with body mass index (BMI) of 40.0 to 44.9 in adult, unspecified obesity type (HCC) -     phentermine (ADIPEX-P) 37.5 MG tablet; Take 1 tablet (37.5 mg total) by mouth daily before breakfast. -  Lipid panel; Future  Vitamin D deficiency -     VITAMIN D 25 Hydroxy (Vit-D Deficiency, Fractures); Future -     Vitamin D, Ergocalciferol, (DRISDOL) 50000 units CAPS capsule; Take 1 capsule (50,000 Units total) by mouth every 7 (seven) days.  Encounter for lipid screening for cardiovascular disease -     Lipid panel; Future   I am having Julia Ibarra start on phentermine. I am also having her maintain her levonorgestrel-ethinyl estradiol, vitamin B-12, and Vitamin D (Ergocalciferol).  Meds ordered this encounter  Medications  . phentermine (ADIPEX-P) 37.5 MG tablet    Sig: Take 1 tablet (37.5 mg total) by mouth  daily before breakfast.    Dispense:  30 tablet    Refill:  1    Order Specific Question:   Supervising Provider    Answer:   Lucille Passy [[id number]]  . Vitamin D, Ergocalciferol, (DRISDOL) 50000 units CAPS capsule    Sig: Take 1 capsule (50,000 Units total) by mouth every 7 (seven) days.    Dispense:  6 capsule    Refill:  0    Order Specific Question:   Supervising Provider    Answer:   Lucille Passy [[id number]]    Follow-up: Return in about 7 weeks (around 04/20/2018) for weight loss management.Wilfred Lacy, NP

## 2018-03-05 NOTE — Patient Instructions (Signed)
Go to [facility] for blood draw (need to be fasting at least 6-8hrs prior to blood draw).  Check BP 3x/week and record.  Phentermine sustained-release capsules What is this medicine? PHENTERMINE (FEN ter meen) decreases your appetite. It is used with a reduced calorie diet and exercise to help you lose weight. This medicine may be used for other purposes; ask your health care provider or pharmacist if you have questions. COMMON BRAND NAME(S): Ionamin, Pro-Fast What should I tell my health care provider before I take this medicine? They need to know if you have any of these conditions: -agitation -glaucoma -heart disease -high blood pressure -history of substance abuse -lung disease called Primary Pulmonary Hypertension (PPH) -taken an MAOI like Carbex, Eldepryl, Marplan, Nardil, or Parnate in last 14 days -thyroid disease -an unusual or allergic reaction to phentermine, other medicines, foods, dyes, or preservatives -pregnant or trying to get pregnant -breast-feeding How should I use this medicine? Take this medicine by mouth with a glass of water. Follow the directions on the prescription label. This medicine is usually taken before breakfast or at least 10 to 14 hours before going to bed. Avoid taking this medicine in the evening. It may interfere with sleep. Swallow whole. Do not open or chew the capsules. Take your doses at regular intervals. Do not take your medicine more often than directed. Talk to your pediatrician regarding the use of this medicine in children. Special care may be needed. Overdosage: If you think you have taken too much of this medicine contact a poison control center or emergency room at once. NOTE: This medicine is only for you. Do not share this medicine with others. What if I miss a dose? If you miss a dose, take it as soon as you can. If it is almost time for your next dose, take only that dose. Do not take double or extra doses. What may interact with this  medicine? Do not take this medicine with any of the following medications: -duloxetine -MAOIs like Carbex, Eldepryl, Marplan, Nardil, and Parnate -medicines for colds or breathing difficulties like pseudoephedrine or phenylephrine -procarbazine -sibutramine -SSRIs like citalopram, escitalopram, fluoxetine, fluvoxamine, paroxetine, and sertraline -stimulants like dexmethylphenidate, methylphenidate or modafinil -venlafaxine This medicine may also interact with the following medications: -medicines for diabetes This list may not describe all possible interactions. Give your health care provider a list of all the medicines, herbs, non-prescription drugs, or dietary supplements you use. Also tell them if you smoke, drink alcohol, or use illegal drugs. Some items may interact with your medicine. What should I watch for while using this medicine? Notify your physician immediately if you become short of breath while doing your normal activities. Do not take this medicine within 6 hours of bedtime. It can keep you from getting to sleep. Avoid drinks that contain caffeine and try to stick to a regular bedtime every night. This medicine was intended to be used in addition to a healthy diet and exercise. The best results are achieved this way. This medicine is only indicated for short-term use. Eventually your weight loss may level out. At that point, the drug will only help you maintain your new weight. Do not increase or in any way change your dose without consulting your doctor. You may get drowsy or dizzy. Do not drive, use machinery, or do anything that needs mental alertness until you know how this medicine affects you. Do not stand or sit up quickly, especially if you are an older patient. This reduces  the risk of dizzy or fainting spells. Alcohol may increase dizziness and drowsiness. Avoid alcoholic drinks. What side effects may I notice from receiving this medicine? Side effects that you should  report to your doctor or health care professional as soon as possible: -chest pain, palpitations -depression or severe changes in mood -increased blood pressure -irritability -nervousness or restlessness -severe dizziness -shortness of breath -problems urinating -unusual swelling of the legs -vomiting Side effects that usually do not require medical attention (report to your doctor or health care professional if they continue or are bothersome): -blurred vision or other eye problems -changes in sexual ability or desire -constipation or diarrhea -difficulty sleeping -dry mouth or unpleasant taste -headache -nausea This list may not describe all possible side effects. Call your doctor for medical advice about side effects. You may report side effects to FDA at 1-800-FDA-1088. Where should I keep my medicine? Keep out of the reach of children. This medicine can be abused. Keep your medicine in a safe place to protect it from theft. Do not share this medicine with anyone. Selling or giving away this medicine is dangerous and against the law. This medicine may cause accidental overdose and death if taken by other adults, children, or pets. Mix any unused medicine with a substance like cat litter or coffee grounds. Then throw the medicine away in a sealed container like a sealed bag or a coffee can with a lid. Do not use the medicine after the expiration date. Store at room temperature between 20 and 25 degrees C (68 and 77 degrees F). Keep container tightly closed. NOTE: This sheet is a summary. It may not cover all possible information. If you have questions about this medicine, talk to your doctor, pharmacist, or health care provider.  2018 Elsevier/Gold Standard (2014-06-28 16:19:17)

## 2018-03-06 ENCOUNTER — Encounter: Payer: Self-pay | Admitting: Nurse Practitioner

## 2018-03-06 ENCOUNTER — Other Ambulatory Visit (INDEPENDENT_AMBULATORY_CARE_PROVIDER_SITE_OTHER): Payer: 59

## 2018-03-06 DIAGNOSIS — Z136 Encounter for screening for cardiovascular disorders: Secondary | ICD-10-CM

## 2018-03-06 DIAGNOSIS — E559 Vitamin D deficiency, unspecified: Secondary | ICD-10-CM | POA: Diagnosis not present

## 2018-03-06 DIAGNOSIS — Z6841 Body Mass Index (BMI) 40.0 and over, adult: Secondary | ICD-10-CM | POA: Diagnosis not present

## 2018-03-06 DIAGNOSIS — Z13220 Encounter for screening for lipoid disorders: Secondary | ICD-10-CM

## 2018-03-06 LAB — LIPID PANEL
CHOL/HDL RATIO: 2
Cholesterol: 165 mg/dL (ref 0–200)
HDL: 81.8 mg/dL (ref >39.00)
LDL Cholesterol: 71 mg/dL (ref 0–99)
NONHDL: 83.44
Triglycerides: 60 mg/dL (ref 0.0–149.0)
VLDL: 12 mg/dL (ref 0.0–40.0)

## 2018-03-06 LAB — VITAMIN D 25 HYDROXY (VIT D DEFICIENCY, FRACTURES): VITD: 14.11 ng/mL — AB (ref 30.00–100.00)

## 2018-03-06 MED ORDER — VITAMIN D (ERGOCALCIFEROL) 1.25 MG (50000 UNIT) PO CAPS: 50000.0000 [IU] | ORAL_CAPSULE | ORAL | 0 refills | Status: DC

## 2018-03-06 NOTE — Addendum Note (Signed)
Addended by: Wilfred Lacy L on: 03/06/2018 01:00 PM   Modules accepted: Orders

## 2018-04-21 DIAGNOSIS — Z1231 Encounter for screening mammogram for malignant neoplasm of breast: Secondary | ICD-10-CM | POA: Diagnosis not present

## 2018-04-21 DIAGNOSIS — Z01419 Encounter for gynecological examination (general) (routine) without abnormal findings: Secondary | ICD-10-CM | POA: Diagnosis not present

## 2018-04-24 ENCOUNTER — Ambulatory Visit: Payer: 59 | Admitting: Nurse Practitioner

## 2018-05-01 ENCOUNTER — Ambulatory Visit (INDEPENDENT_AMBULATORY_CARE_PROVIDER_SITE_OTHER): Payer: 59 | Admitting: Nurse Practitioner

## 2018-05-01 ENCOUNTER — Encounter: Payer: Self-pay | Admitting: Nurse Practitioner

## 2018-05-01 VITALS — BP 118/80 | HR 84 | Temp 98.5°F | Ht 65.0 in | Wt 241.0 lb

## 2018-05-01 DIAGNOSIS — Z6841 Body Mass Index (BMI) 40.0 and over, adult: Secondary | ICD-10-CM | POA: Diagnosis not present

## 2018-05-01 DIAGNOSIS — E559 Vitamin D deficiency, unspecified: Secondary | ICD-10-CM | POA: Diagnosis not present

## 2018-05-01 DIAGNOSIS — Z713 Dietary counseling and surveillance: Secondary | ICD-10-CM | POA: Diagnosis not present

## 2018-05-01 LAB — BASIC METABOLIC PANEL
BUN: 14 mg/dL (ref 6–23)
CALCIUM: 9.3 mg/dL (ref 8.4–10.5)
CO2: 26 mEq/L (ref 19–32)
Chloride: 107 mEq/L (ref 96–112)
Creatinine, Ser: 0.87 mg/dL (ref 0.40–1.20)
GFR: 92.77 mL/min (ref >60.00)
GLUCOSE: 106 mg/dL — AB (ref 70–99)
POTASSIUM: 4.4 meq/L (ref 3.5–5.1)
SODIUM: 139 meq/L (ref 135–145)

## 2018-05-01 MED ORDER — PHENTERMINE HCL 37.5 MG PO TABS: 37.5000 mg | ORAL_TABLET | Freq: Every day | ORAL | 1 refills | Status: DC

## 2018-05-01 NOTE — Progress Notes (Signed)
Subjective:  Patient ID: Julia Ibarra, female    DOB: 15-Sep-1976  Age: 42 y.o. MRN: [id number]  CC: Follow-up (follow up on weight/lost 6 lb since May and she notice inches off her waist line. BP keep up once a week but normally running 120/63-she fot the reading with her. FYI pt will call back when she had tdap)  HPI   Weight loss Management: Lost 6Lbs in 33months. Reports decrease in biometric measurements, but lost readings while moving. Denies any CP, SOB, palpitation, edema, nausea, dizziness, or insomnia. Wt Readings from Last 3 Encounters:  05/01/18 241 lb (109.3 kg)  03/05/18 247 lb (112 kg)  12/31/17 238 lb (108 kg)   Vitamin D deficiency: Completed 6weeks course. Last checked 02/2018: 14.11  Reviewed [facility] today.  Outpatient Medications Prior to Visit  Medication Sig Dispense Refill  . levonorgestrel-ethinyl estradiol (NORDETTE) 0.15-30 MG-MCG tablet Take 1 tablet by mouth daily.    . phentermine (ADIPEX-P) 37.5 MG tablet Take 1 tablet (37.5 mg total) by mouth daily before breakfast. 30 tablet 1  . vitamin B-12 (CYANOCOBALAMIN) 1000 MCG tablet Take 2 tablets (2,000 mcg total) by mouth daily. 60 tablet 0  . Vitamin D, Ergocalciferol, (DRISDOL) 50000 units CAPS capsule Take 1 capsule (50,000 Units total) by mouth every 7 (seven) days. 6 capsule 0   No facility-administered medications prior to visit.     ROS See HPI  Objective:  BP 118/80   Pulse 84   Temp 98.5 F (36.9 C) (Oral)   Ht 5\' 5"  (1.651 m)   Wt 241 lb (109.3 kg)   SpO2 97%   BMI 40.10 kg/m   BP Readings from Last 3 Encounters:  05/01/18 118/80  03/05/18 124/80  12/31/17 123/78    Wt Readings from Last 3 Encounters:  05/01/18 241 lb (109.3 kg)  03/05/18 247 lb (112 kg)  12/31/17 238 lb (108 kg)    Physical Exam  Constitutional: She is oriented to person, place, and time.  Cardiovascular: Normal rate and regular rhythm. Exam reveals no gallop.  No murmur heard. Pulmonary/Chest: Effort  normal and breath sounds normal.  Musculoskeletal: She exhibits no edema.  Neurological: She is alert and oriented to person, place, and time.  Psychiatric: She has a normal mood and affect. Her behavior is normal. Thought content normal.  Vitals reviewed.   Lab Results  Component Value Date   WBC 6.7 12/03/2017   HGB 11.7 12/03/2017   HCT 37.3 12/03/2017   GLUCOSE 98 12/03/2017   CHOL 165 03/06/2018   TRIG 60.0 03/06/2018   HDL 81.80 03/06/2018   LDLCALC 71 03/06/2018   ALT 7 12/03/2017   AST 9 12/03/2017   NA 140 12/03/2017   K 4.2 12/03/2017   CL 107 (H) 12/03/2017   CREATININE 0.77 12/03/2017   BUN 10 12/03/2017   CO2 21 12/03/2017   TSH 1.530 12/03/2017   HGBA1C 5.2 12/03/2017    Assessment & Plan:   Maysa was seen today for follow-up.  Diagnoses and all orders for this visit:  Encounter for weight loss counseling -     Basic metabolic panel  Class 3 severe obesity without serious comorbidity with body mass index (BMI) of 40.0 to 44.9 in adult, unspecified obesity type ([place]) -     Basic metabolic panel  Vitamin D deficiency -     Vitamin D 1,25 dihydroxy   I am having Natoya E. Rattray maintain her levonorgestrel-ethinyl estradiol, vitamin B-12, phentermine, and Vitamin D (Ergocalciferol).  No orders of the defined types were placed in this encounter.   Follow-up: Return in about 2 months (around 07/02/2018) for weight loss management.  Wilfred Lacy, NP

## 2018-05-01 NOTE — Patient Instructions (Signed)
continue with diet changes and portion control Continue with exercise.  Go to lab for blood draw. You will be called with results.   Calorie Counting for Weight Loss Calories are units of energy. Your body needs a certain amount of calories from food to keep you going throughout the day. When you eat more calories than your body needs, your body stores the extra calories as fat. When you eat fewer calories than your body needs, your body burns fat to get the energy it needs. Calorie counting means keeping track of how many calories you eat and drink each day. Calorie counting can be helpful if you need to lose weight. If you make sure to eat fewer calories than your body needs, you should lose weight. Ask your health care provider what a healthy weight is for you. For calorie counting to work, you will need to eat the right number of calories in a day in order to lose a healthy amount of weight per week. A dietitian can help you determine how many calories you need in a day and will give you suggestions on how to reach your calorie goal.  A healthy amount of weight to lose per week is usually 1-2 lb (0.5-0.9 kg). This usually means that your daily calorie intake should be reduced by 500-750 calories.  Eating 1,200 - 1,500 calories per day can help most women lose weight.  Eating 1,500 - 1,800 calories per day can help most men lose weight.  What is my plan? My goal is to have ____1800______ calories per day. If I have this many calories per day, I should lose around ____1______ pounds per week. What do I need to know about calorie counting? In order to meet your daily calorie goal, you will need to:  Find out how many calories are in each food you would like to eat. Try to do this before you eat.  Decide how much of the food you plan to eat.  Write down what you ate and how many calories it had. Doing this is called keeping a food log.  To successfully lose weight, it is important to  balance calorie counting with a healthy lifestyle that includes regular activity. Aim for 150 minutes of moderate exercise (such as walking) or 75 minutes of vigorous exercise (such as running) each week. Where do I find calorie information?  The number of calories in a food can be found on a Nutrition Facts label. If a food does not have a Nutrition Facts label, try to look up the calories online or ask your dietitian for help. Remember that calories are listed per serving. If you choose to have more than one serving of a food, you will have to multiply the calories per serving by the amount of servings you plan to eat. For example, the label on a package of bread might say that a serving size is 1 slice and that there are 90 calories in a serving. If you eat 1 slice, you will have eaten 90 calories. If you eat 2 slices, you will have eaten 180 calories. How do I keep a food log? Immediately after each meal, record the following information in your food log:  What you ate. Don't forget to include toppings, sauces, and other extras on the food.  How much you ate. This can be measured in cups, ounces, or number of items.  How many calories each food and drink had.  The total number of calories in the  meal.  Keep your food log near you, such as in a small notebook in your pocket, or use a mobile app or website. Some programs will calculate calories for you and show you how many calories you have left for the day to meet your goal. What are some calorie counting tips?  Use your calories on foods and drinks that will fill you up and not leave you hungry: ? Some examples of foods that fill you up are nuts and nut butters, vegetables, lean proteins, and high-fiber foods like whole grains. High-fiber foods are foods with more than 5 g fiber per serving. ? Drinks such as sodas, specialty coffee drinks, alcohol, and juices have a lot of calories, yet do not fill you up.  Eat nutritious foods and avoid  empty calories. Empty calories are calories you get from foods or beverages that do not have many vitamins or protein, such as candy, sweets, and soda. It is better to have a nutritious high-calorie food (such as an avocado) than a food with few nutrients (such as a bag of chips).  Know how many calories are in the foods you eat most often. This will help you calculate calorie counts faster.  Pay attention to calories in drinks. Low-calorie drinks include water and unsweetened drinks.  Pay attention to nutrition labels for "low fat" or "fat free" foods. These foods sometimes have the same amount of calories or more calories than the full fat versions. They also often have added sugar, starch, or salt, to make up for flavor that was removed with the fat.  Find a way of tracking calories that works for you. Get creative. Try different apps or programs if writing down calories does not work for you. What are some portion control tips?  Know how many calories are in a serving. This will help you know how many servings of a certain food you can have.  Use a measuring cup to measure serving sizes. You could also try weighing out portions on a kitchen scale. With time, you will be able to estimate serving sizes for some foods.  Take some time to put servings of different foods on your favorite plates, bowls, and cups so you know what a serving looks like.  Try not to eat straight from a bag or box. Doing this can lead to overeating. Put the amount you would like to eat in a cup or on a plate to make sure you are eating the right portion.  Use smaller plates, glasses, and bowls to prevent overeating.  Try not to multitask (for example, watch TV or use your computer) while eating. If it is time to eat, sit down at a table and enjoy your food. This will help you to know when you are full. It will also help you to be aware of what you are eating and how much you are eating. What are tips for following  this plan? Reading food labels  Check the calorie count compared to the serving size. The serving size may be smaller than what you are used to eating.  Check the source of the calories. Make sure the food you are eating is high in vitamins and protein and low in saturated and trans fats. Shopping  Read nutrition labels while you shop. This will help you make healthy decisions before you decide to purchase your food.  Make a grocery list and stick to it. Cooking  Try to cook your favorite foods in a healthier way.  For example, try baking instead of frying.  Use low-fat dairy products. Meal planning  Use more fruits and vegetables. Half of your plate should be fruits and vegetables.  Include lean proteins like poultry and fish. How do I count calories when eating out?  Ask for smaller portion sizes.  Consider sharing an entree and sides instead of getting your own entree.  If you get your own entree, eat only half. Ask for a box at the beginning of your meal and put the rest of your entree in it so you are not tempted to eat it.  If calories are listed on the menu, choose the lower calorie options.  Choose dishes that include vegetables, fruits, whole grains, low-fat dairy products, and lean protein.  Choose items that are boiled, broiled, grilled, or steamed. Stay away from items that are buttered, battered, fried, or served with cream sauce. Items labeled "crispy" are usually fried, unless stated otherwise.  Choose water, low-fat milk, unsweetened iced tea, or other drinks without added sugar. If you want an alcoholic beverage, choose a lower calorie option such as a glass of wine or light beer.  Ask for dressings, sauces, and syrups on the side. These are usually high in calories, so you should limit the amount you eat.  If you want a salad, choose a garden salad and ask for grilled meats. Avoid extra toppings like bacon, cheese, or fried items. Ask for the dressing on the  side, or ask for olive oil and vinegar or lemon to use as dressing.  Estimate how many servings of a food you are given. For example, a serving of cooked rice is  cup or about the size of half a baseball. Knowing serving sizes will help you be aware of how much food you are eating at restaurants. The list below tells you how big or small some common portion sizes are based on everyday objects: ? 1 oz-4 stacked dice. ? 3 oz-1 deck of cards. ? 1 tsp-1 die. ? 1 Tbsp- a ping-pong ball. ? 2 Tbsp-1 ping-pong ball. ?  cup- baseball. ? 1 cup-1 baseball. Summary  Calorie counting means keeping track of how many calories you eat and drink each day. If you eat fewer calories than your body needs, you should lose weight.  A healthy amount of weight to lose per week is usually 1-2 lb (0.5-0.9 kg). This usually means reducing your daily calorie intake by 500-750 calories.  The number of calories in a food can be found on a Nutrition Facts label. If a food does not have a Nutrition Facts label, try to look up the calories online or ask your dietitian for help.  Use your calories on foods and drinks that will fill you up, and not on foods and drinks that will leave you hungry.  Use smaller plates, glasses, and bowls to prevent overeating. This information is not intended to replace advice given to you by your health care provider. Make sure you discuss any questions you have with your health care provider. Document Released: 10/07/2005 Document Revised: 09/06/2016 Document Reviewed: 09/06/2016 Elsevier Interactive Patient Education  [employer].

## 2018-05-06 LAB — VITAMIN D 1,25 DIHYDROXY
VITAMIN D2 1, 25 (OH): 42 pg/mL
VITAMIN D3 1, 25 (OH): 20 pg/mL
Vitamin D 1, 25 (OH)2 Total: 62 pg/mL (ref 18–72)

## 2018-07-03 ENCOUNTER — Ambulatory Visit: Payer: 59 | Admitting: Nurse Practitioner

## 2018-07-07 ENCOUNTER — Ambulatory Visit (INDEPENDENT_AMBULATORY_CARE_PROVIDER_SITE_OTHER): Payer: 59 | Admitting: Nurse Practitioner

## 2018-07-07 ENCOUNTER — Telehealth: Payer: Self-pay | Admitting: Nurse Practitioner

## 2018-07-07 ENCOUNTER — Encounter: Payer: Self-pay | Admitting: Nurse Practitioner

## 2018-07-07 VITALS — BP 126/86 | HR 73 | Temp 98.4°F | Ht 65.0 in | Wt 242.0 lb

## 2018-07-07 DIAGNOSIS — Z713 Dietary counseling and surveillance: Secondary | ICD-10-CM | POA: Diagnosis not present

## 2018-07-07 DIAGNOSIS — E559 Vitamin D deficiency, unspecified: Secondary | ICD-10-CM

## 2018-07-07 DIAGNOSIS — Z6841 Body Mass Index (BMI) 40.0 and over, adult: Secondary | ICD-10-CM

## 2018-07-07 MED ORDER — LORCASERIN HCL ER 20 MG PO TB24: 1.0000 | ORAL_TABLET | Freq: Every day | ORAL | 5 refills | Status: DC

## 2018-07-07 MED ORDER — VITAMIN D (CHOLECALCIFEROL) 25 MCG (1000 UT) PO CAPS: 1.0000 | ORAL_CAPSULE | Freq: Every day | ORAL | Status: DC

## 2018-07-07 NOTE — Progress Notes (Signed)
Subjective:  Patient ID: Julia Ibarra, female    DOB: [birth year]  Age: 42 y.o. MRN: [id number]  CC: Follow-up (follow up weight loss. )  HPI  Obesity: BMI 40.3 Use of Phentermine x46months, lost 6lbs Diet: small portions, no soda, low fat, calorie count (1900Kcal) Exercise: 3x/week, water aerobics and walking Wt Readings from Last 3 Encounters:  07/07/18 242 lb (109.8 kg)  05/01/18 241 lb (109.3 kg)  03/05/18 247 lb (112 kg)   Biometric measurements: Neck 14inch Waist 46inch Hips 52inch R. Arm 13inch R. Thigh 25inch Chest 45inch.  Reviewed past Medical, Social and Family history today.  Outpatient Medications Prior to Visit  Medication Sig Dispense Refill  . levonorgestrel-ethinyl estradiol (NORDETTE) 0.15-30 MG-MCG tablet Take 1 tablet by mouth daily.    . phentermine (ADIPEX-P) 37.5 MG tablet Take 1 tablet (37.5 mg total) by mouth daily before breakfast. 30 tablet 1  . vitamin B-12 (CYANOCOBALAMIN) 1000 MCG tablet Take 2 tablets (2,000 mcg total) by mouth daily. (Patient not taking: Reported on 07/07/2018) 60 tablet 0  . Vitamin D, Ergocalciferol, (DRISDOL) 50000 units CAPS capsule Take 1 capsule (50,000 Units total) by mouth every 7 (seven) days. (Patient not taking: Reported on 07/07/2018) 6 capsule 0   No facility-administered medications prior to visit.     ROS See HPI  Objective:  BP 126/86   Pulse 73   Temp 98.4 F (36.9 C) (Oral)   Ht 5\' 5"  (1.651 m)   Wt 242 lb (109.8 kg)   SpO2 100%   BMI 40.27 kg/m   BP Readings from Last 3 Encounters:  07/07/18 126/86  05/01/18 118/80  03/05/18 124/80    Wt Readings from Last 3 Encounters:  07/07/18 242 lb (109.8 kg)  05/01/18 241 lb (109.3 kg)  03/05/18 247 lb (112 kg)    Physical Exam  Constitutional: She is oriented to person, place, and time. She appears well-developed and well-nourished.  Cardiovascular: Normal rate.  Pulmonary/Chest: Effort normal.  Neurological: She is alert and oriented to  person, place, and time.  Psychiatric: She has a normal mood and affect. Her behavior is normal. Thought content normal.  Vitals reviewed.   Lab Results  Component Value Date   WBC 6.7 12/03/2017   HGB 11.7 12/03/2017   HCT 37.3 12/03/2017   GLUCOSE 106 (H) 05/01/2018   CHOL 165 03/06/2018   TRIG 60.0 03/06/2018   HDL 81.80 03/06/2018   LDLCALC 71 03/06/2018   ALT 7 12/03/2017   AST 9 12/03/2017   NA 139 05/01/2018   K 4.4 05/01/2018   CL 107 05/01/2018   CREATININE 0.87 05/01/2018   BUN 14 05/01/2018   CO2 26 05/01/2018   TSH 1.530 12/03/2017   HGBA1C 5.2 12/03/2017    Assessment & Plan:   Julia Ibarra was seen today for follow-up.  Diagnoses and all orders for this visit:  Class 3 severe obesity without serious comorbidity with body mass index (BMI) of 40.0 to 44.9 in adult, unspecified obesity type (HCC) -     Lorcaserin HCl ER (BELVIQ XR) 20 MG TB24; Take 1 tablet by mouth daily after breakfast.  Vitamin D insufficiency -     Vitamin D, Cholecalciferol, 1000 units CAPS; Take 1 capsule by mouth daily after breakfast.  Encounter for weight loss counseling -     Lorcaserin HCl ER (BELVIQ XR) 20 MG TB24; Take 1 tablet by mouth daily after breakfast.   I have discontinued Livi E. Rattray's Vitamin D (Ergocalciferol) and phentermine. I  am also having her start on Vitamin D (Cholecalciferol) and Lorcaserin HCl ER. Additionally, I am having her maintain her levonorgestrel-ethinyl estradiol and vitamin B-12.  Meds ordered this encounter  Medications  . Vitamin D, Cholecalciferol, 1000 units CAPS    Sig: Take 1 capsule by mouth daily after breakfast.    Dispense:  60 capsule    Order Specific Question:   Supervising Provider    Answer:   MATTHEWS, CODY [[id number]]  . Lorcaserin HCl ER (BELVIQ XR) 20 MG TB24    Sig: Take 1 tablet by mouth daily after breakfast.    Dispense:  30 tablet    Refill:  5    Order Specific Question:   Supervising Provider    Answer:   Zigmund Daniel,  CODY [[id number]]    Follow-up: Return in about 6 months (around 01/05/2019) for weight loss and BP eval (needs BMP).  Wilfred Lacy, NP

## 2018-07-07 NOTE — Telephone Encounter (Signed)
PA for Belviq started. Waiting for result.    (Key: [id number])

## 2018-07-07 NOTE — Patient Instructions (Signed)
Complete current prescription of phentermine, then switch to belviq as prescribed.  Continue use of vitamin d and b12 supplement OTC.

## 2018-07-08 NOTE — Telephone Encounter (Signed)
PA denied. Insurance will pay for contrave.   Pt is aware, she wants to use the belviq with coupon first.

## 2018-08-28 ENCOUNTER — Other Ambulatory Visit (INDEPENDENT_AMBULATORY_CARE_PROVIDER_SITE_OTHER): Payer: 59

## 2018-08-28 ENCOUNTER — Encounter: Payer: Self-pay | Admitting: Nurse Practitioner

## 2018-08-28 DIAGNOSIS — Z3201 Encounter for pregnancy test, result positive: Secondary | ICD-10-CM | POA: Diagnosis not present

## 2018-08-28 LAB — HCG, QUANTITATIVE, PREGNANCY: Quantitative HCG: 0.16 m[IU]/mL

## 2018-11-07 DIAGNOSIS — H5213 Myopia, bilateral: Secondary | ICD-10-CM | POA: Diagnosis not present

## 2018-11-07 DIAGNOSIS — H52223 Regular astigmatism, bilateral: Secondary | ICD-10-CM | POA: Diagnosis not present

## 2019-01-05 ENCOUNTER — Ambulatory Visit: Payer: 59 | Admitting: Nurse Practitioner

## 2019-02-19 ENCOUNTER — Telehealth: Payer: Self-pay | Admitting: Nurse Practitioner

## 2019-02-19 NOTE — Telephone Encounter (Signed)
Called and left vm for patient. Calling to schedule virtual visit with Baldo Ash.

## 2019-02-23 ENCOUNTER — Telehealth: Payer: 59 | Admitting: Adult Health

## 2019-02-25 ENCOUNTER — Telehealth: Payer: 59 | Admitting: Adult Health

## 2019-03-01 ENCOUNTER — Telehealth: Payer: 59 | Admitting: Adult Health

## 2019-03-16 ENCOUNTER — Encounter (INDEPENDENT_AMBULATORY_CARE_PROVIDER_SITE_OTHER): Payer: 59 | Admitting: Nurse Practitioner

## 2019-03-16 ENCOUNTER — Encounter: Payer: Self-pay | Admitting: Nurse Practitioner

## 2019-03-16 ENCOUNTER — Other Ambulatory Visit: Payer: 59

## 2019-03-16 DIAGNOSIS — Z3201 Encounter for pregnancy test, result positive: Secondary | ICD-10-CM | POA: Diagnosis not present

## 2019-03-16 LAB — HCG, QUANTITATIVE, PREGNANCY: Quantitative HCG: 208.98 m[IU]/mL

## 2019-04-07 DIAGNOSIS — N911 Secondary amenorrhea: Secondary | ICD-10-CM | POA: Diagnosis not present

## 2019-04-13 DIAGNOSIS — Z3482 Encounter for supervision of other normal pregnancy, second trimester: Secondary | ICD-10-CM | POA: Diagnosis not present

## 2019-04-13 DIAGNOSIS — Z3481 Encounter for supervision of other normal pregnancy, first trimester: Secondary | ICD-10-CM | POA: Diagnosis not present

## 2019-04-13 DIAGNOSIS — Z113 Encounter for screening for infections with a predominantly sexual mode of transmission: Secondary | ICD-10-CM | POA: Diagnosis not present

## 2019-04-13 DIAGNOSIS — Z3685 Encounter for antenatal screening for Streptococcus B: Secondary | ICD-10-CM | POA: Diagnosis not present

## 2019-04-13 DIAGNOSIS — Z3480 Encounter for supervision of other normal pregnancy, unspecified trimester: Secondary | ICD-10-CM | POA: Diagnosis not present

## 2019-04-13 DIAGNOSIS — Z3483 Encounter for supervision of other normal pregnancy, third trimester: Secondary | ICD-10-CM | POA: Diagnosis not present

## 2019-04-13 DIAGNOSIS — Z3400 Encounter for supervision of normal first pregnancy, unspecified trimester: Secondary | ICD-10-CM | POA: Diagnosis not present

## 2019-04-13 LAB — OB RESULTS CONSOLE GC/CHLAMYDIA
Chlamydia: NEGATIVE
Gonorrhea: NEGATIVE

## 2019-04-13 LAB — OB RESULTS CONSOLE RPR: RPR: NONREACTIVE

## 2019-04-13 LAB — OB RESULTS CONSOLE ABO/RH: RH Type: POSITIVE

## 2019-04-13 LAB — OB RESULTS CONSOLE HIV ANTIBODY (ROUTINE TESTING): HIV: NONREACTIVE

## 2019-04-13 LAB — OB RESULTS CONSOLE ANTIBODY SCREEN: Antibody Screen: NEGATIVE

## 2019-04-13 LAB — OB RESULTS CONSOLE HEPATITIS B SURFACE ANTIGEN: Hepatitis B Surface Ag: NEGATIVE

## 2019-04-28 DIAGNOSIS — Z113 Encounter for screening for infections with a predominantly sexual mode of transmission: Secondary | ICD-10-CM | POA: Diagnosis not present

## 2019-04-28 DIAGNOSIS — Z124 Encounter for screening for malignant neoplasm of cervix: Secondary | ICD-10-CM | POA: Diagnosis not present

## 2019-04-28 DIAGNOSIS — Z3400 Encounter for supervision of normal first pregnancy, unspecified trimester: Secondary | ICD-10-CM | POA: Diagnosis not present

## 2019-04-28 DIAGNOSIS — Z331 Pregnant state, incidental: Secondary | ICD-10-CM | POA: Diagnosis not present

## 2019-04-28 DIAGNOSIS — Z3480 Encounter for supervision of other normal pregnancy, unspecified trimester: Secondary | ICD-10-CM | POA: Diagnosis not present

## 2019-05-10 DIAGNOSIS — Z3A12 12 weeks gestation of pregnancy: Secondary | ICD-10-CM | POA: Diagnosis not present

## 2019-05-10 DIAGNOSIS — Z3682 Encounter for antenatal screening for nuchal translucency: Secondary | ICD-10-CM | POA: Diagnosis not present

## 2019-05-10 DIAGNOSIS — O09521 Supervision of elderly multigravida, first trimester: Secondary | ICD-10-CM | POA: Diagnosis not present

## 2019-06-24 DIAGNOSIS — O09522 Supervision of elderly multigravida, second trimester: Secondary | ICD-10-CM | POA: Diagnosis not present

## 2019-06-24 DIAGNOSIS — Z3A18 18 weeks gestation of pregnancy: Secondary | ICD-10-CM | POA: Diagnosis not present

## 2019-06-24 DIAGNOSIS — Z361 Encounter for antenatal screening for raised alphafetoprotein level: Secondary | ICD-10-CM | POA: Diagnosis not present

## 2019-06-24 DIAGNOSIS — Z363 Encounter for antenatal screening for malformations: Secondary | ICD-10-CM | POA: Diagnosis not present

## 2019-06-24 DIAGNOSIS — Z3492 Encounter for supervision of normal pregnancy, unspecified, second trimester: Secondary | ICD-10-CM | POA: Diagnosis not present

## 2019-06-24 DIAGNOSIS — Z3480 Encounter for supervision of other normal pregnancy, unspecified trimester: Secondary | ICD-10-CM | POA: Diagnosis not present

## 2019-07-22 DIAGNOSIS — Z3492 Encounter for supervision of normal pregnancy, unspecified, second trimester: Secondary | ICD-10-CM | POA: Diagnosis not present

## 2019-07-22 DIAGNOSIS — Z362 Encounter for other antenatal screening follow-up: Secondary | ICD-10-CM | POA: Diagnosis not present

## 2019-08-25 DIAGNOSIS — Z23 Encounter for immunization: Secondary | ICD-10-CM | POA: Diagnosis not present

## 2019-08-25 DIAGNOSIS — Z3492 Encounter for supervision of normal pregnancy, unspecified, second trimester: Secondary | ICD-10-CM | POA: Diagnosis not present

## 2019-08-25 DIAGNOSIS — Z3480 Encounter for supervision of other normal pregnancy, unspecified trimester: Secondary | ICD-10-CM | POA: Diagnosis not present

## 2019-09-11 ENCOUNTER — Encounter (HOSPITAL_COMMUNITY): Payer: Self-pay

## 2019-09-11 ENCOUNTER — Other Ambulatory Visit: Payer: Self-pay

## 2019-09-11 ENCOUNTER — Emergency Department (HOSPITAL_COMMUNITY): Payer: 59

## 2019-09-11 ENCOUNTER — Emergency Department (HOSPITAL_COMMUNITY)
Admission: EM | Admit: 2019-09-11 | Discharge: 2019-09-12 | Disposition: A | Discharge status: Home / Self Care | Payer: 59 | Attending: Emergency Medicine | Admitting: Emergency Medicine

## 2019-09-11 DIAGNOSIS — J069 Acute upper respiratory infection, unspecified: Secondary | ICD-10-CM | POA: Insufficient documentation

## 2019-09-11 DIAGNOSIS — B9789 Other viral agents as the cause of diseases classified elsewhere: Secondary | ICD-10-CM | POA: Diagnosis not present

## 2019-09-11 DIAGNOSIS — Z3A29 29 weeks gestation of pregnancy: Secondary | ICD-10-CM | POA: Diagnosis not present

## 2019-09-11 DIAGNOSIS — O99891 Other specified diseases and conditions complicating pregnancy: Secondary | ICD-10-CM | POA: Insufficient documentation

## 2019-09-11 DIAGNOSIS — R05 Cough: Secondary | ICD-10-CM | POA: Diagnosis not present

## 2019-09-11 DIAGNOSIS — Z79899 Other long term (current) drug therapy: Secondary | ICD-10-CM | POA: Diagnosis not present

## 2019-09-11 DIAGNOSIS — Z20828 Contact with and (suspected) exposure to other viral communicable diseases: Secondary | ICD-10-CM | POA: Diagnosis not present

## 2019-09-11 DIAGNOSIS — R0789 Other chest pain: Secondary | ICD-10-CM | POA: Diagnosis not present

## 2019-09-11 LAB — D-DIMER, QUANTITATIVE: D-Dimer, Quant: 0.49 ug/mL-FEU (ref 0.00–0.50)

## 2019-09-11 LAB — BASIC METABOLIC PANEL
Anion gap: 10 (ref 5–15)
BUN: 6 mg/dL (ref 6–20)
CO2: 18 mmol/L — ABNORMAL LOW (ref 22–32)
Calcium: 9.8 mg/dL (ref 8.9–10.3)
Chloride: 104 mmol/L (ref 98–111)
Creatinine, Ser: 0.7 mg/dL (ref 0.44–1.00)
GFR calc Af Amer: >60 mL/min (ref >60)
GFR calc non Af Amer: >60 mL/min (ref >60)
Glucose, Bld: 115 mg/dL — ABNORMAL HIGH (ref 70–99)
Potassium: 3.5 mmol/L (ref 3.5–5.1)
Sodium: 132 mmol/L — ABNORMAL LOW (ref 135–145)

## 2019-09-11 LAB — CBC
HCT: 34.9 % — ABNORMAL LOW (ref 36.0–46.0)
Hemoglobin: 11.6 g/dL — ABNORMAL LOW (ref 12.0–15.0)
MCH: 28.2 pg (ref 26.0–34.0)
MCHC: 33.2 g/dL (ref 30.0–36.0)
MCV: 84.7 fL (ref 80.0–100.0)
Platelets: 233 10*3/uL (ref 150–400)
RBC: 4.12 MIL/uL (ref 3.87–5.11)
RDW: 12.3 % (ref 11.5–15.5)
WBC: 7.4 10*3/uL (ref 4.0–10.5)
nRBC: 0 % (ref 0.0–0.2)

## 2019-09-11 LAB — I-STAT BETA HCG BLOOD, ED (MC, WL, AP ONLY): I-stat hCG, quantitative: >2000 m[IU]/mL — ABNORMAL HIGH (ref <5)

## 2019-09-11 LAB — TROPONIN I (HIGH SENSITIVITY): Troponin I (High Sensitivity): 3 ng/L (ref <18)

## 2019-09-11 LAB — POC SARS CORONAVIRUS 2 AG -  ED: SARS Coronavirus 2 Ag: NEGATIVE

## 2019-09-11 LAB — NOVEL CORONAVIRUS, NAA

## 2019-09-11 MED ORDER — SODIUM CHLORIDE 0.9 % IV BOLUS
1000.0000 mL | Freq: Once | INTRAVENOUS | Status: AC
  Administered 2019-09-11: 1000 mL via INTRAVENOUS

## 2019-09-11 MED ORDER — DIPHENHYDRAMINE HCL 50 MG/ML IJ SOLN
25.0000 mg | Freq: Once | INTRAMUSCULAR | Status: AC
  Administered 2019-09-11: 25 mg via INTRAVENOUS
  Filled 2019-09-11: qty 1

## 2019-09-11 MED ORDER — METOCLOPRAMIDE HCL 5 MG/ML IJ SOLN
10.0000 mg | Freq: Once | INTRAMUSCULAR | Status: AC
  Administered 2019-09-11: 10 mg via INTRAVENOUS
  Filled 2019-09-11: qty 2

## 2019-09-11 MED ORDER — SODIUM CHLORIDE 0.9% FLUSH: 3.0000 mL | Freq: Once | INTRAVENOUS | Status: DC

## 2019-09-11 MED ORDER — ACETAMINOPHEN 500 MG PO TABS
1000.0000 mg | ORAL_TABLET | Freq: Once | ORAL | Status: AC
  Administered 2019-09-11: 1000 mg via ORAL
  Filled 2019-09-11: qty 2

## 2019-09-11 NOTE — ED Provider Notes (Signed)
[facility] EMERGENCY DEPARTMENT Provider Note   CSN: [id number] Arrival date & time: 09/11/19  1724     History   Chief Complaint Chief Complaint  Patient presents with  . Cough  . Chest Pain    HPI Julia Ibarra is a 43 y.o. female.     HPI  Pt is a 43 year old G3P2002 who is [redacted]wk gestation who presents to the ED with concern for cough and chest pain. Patient reports a coworker tested positive for COVID-19 and she was exposed to them this week.  She states today she began to have headache and then started having worsening body aches, cough, shortness of breath with walking.  Describes her headache as frontal in nature.  No vision changes.  No nausea or vomiting.  No numbness, weakness, tingling.  Patient says she has also experienced intermittent chest and back aching/discomfort.  She has tried Tylenol without much relief. No known fever. No prior cardiac history. No history of blood clots. Regarding her pregnancy, patient states she has not had any complications or illnesses.  She states she is feeling the baby move.  No vaginal bleeding, no contractions, no loss of fluid.  Past Medical History:  Diagnosis Date  . Vitamin D deficiency 12/03/2017    Patient Active Problem List   Diagnosis Date Noted  . Other fatigue 12/03/2017  . Shortness of breath on exertion 12/03/2017  . Elevated blood pressure reading 12/03/2017    Past Surgical History:  Procedure Laterality Date  . CESAREAN SECTION     2  . TONSILLECTOMY       OB History    Gravida  3   Para  2   Term      Preterm      AB      Living        SAB      TAB      Ectopic      Multiple      Live Births  2            Home Medications    Prior to Admission medications   Medication Sig Start Date End Date Taking? Authorizing Provider  acetaminophen (TYLENOL) 500 MG tablet Take 500 mg by mouth every 4 (four) hours as needed for fever or headache (pain).   Yes [provider]  Prenatal Vit-Fe Fumarate-FA (PRENATAL MULTIVITAMIN) TABS tablet Take 1 tablet by mouth daily at 12 noon.   Yes [provider]  Lorcaserin HCl ER (BELVIQ XR) 20 MG TB24 Take 1 tablet by mouth daily after breakfast. Patient not taking: Reported on 09/11/2019 07/07/18   Nche, Charlene Brooke, NP  vitamin B-12 (CYANOCOBALAMIN) 1000 MCG tablet Take 2 tablets (2,000 mcg total) by mouth daily. Patient not taking: Reported on 07/07/2018 12/17/17   Eber Jones, MD  Vitamin D, Cholecalciferol, 1000 units CAPS Take 1 capsule by mouth daily after breakfast. Patient not taking: Reported on 09/11/2019 07/07/18   Nche, Charlene Brooke, NP    Family History Family History  Problem Relation Age of Onset  . Depression Mother   . Anxiety disorder Mother   . Bipolar disorder Mother   . Alcoholism Mother   . Hypertension Maternal Grandmother   . Cancer Maternal Grandmother        cervical cancer    Social History Social History   Tobacco Use  . Smoking status: Never Smoker  . Smokeless tobacco: Never Used  Substance Use Topics  .  Alcohol use: Yes    Alcohol/week: 1.0 - 2.0 standard drinks    Types: 1 - 2 Glasses of wine per week    Comment: On the weekends  . Drug use: No     Allergies   Patient has no known allergies.   Review of Systems Review of Systems  Constitutional: Negative for chills and fever.  HENT: Positive for congestion. Negative for sore throat.   Eyes: Negative for pain and visual disturbance.  Respiratory: Positive for cough, chest tightness and shortness of breath.   Cardiovascular: Positive for chest pain. Negative for palpitations.  Gastrointestinal: Negative for abdominal pain, nausea and vomiting.  Genitourinary: Negative for dysuria and hematuria.  Musculoskeletal: Positive for myalgias. Negative for arthralgias and back pain.  Skin: Negative for color change and rash.  Neurological: Positive for headaches. Negative for seizures and  syncope.  Psychiatric/Behavioral: Negative for agitation and behavioral problems.  All other systems reviewed and are negative.    Physical Exam Updated Vital Signs BP 114/83   Pulse 96   Temp 98.7 F (37.1 C) (Oral)   Resp 12   Ht 5\' 6"  (1.676 m)   Wt 113.4 kg   SpO2 98%   BMI 40.35 kg/m   Physical Exam Vitals signs and nursing note reviewed.  Constitutional:      General: She is not in acute distress.    Appearance: She is well-developed.  HENT:     Head: Normocephalic and atraumatic.     Nose: Congestion present.  Eyes:     Extraocular Movements: Extraocular movements intact.     Conjunctiva/sclera: Conjunctivae normal.     Pupils: Pupils are equal, round, and reactive to light.  Neck:     Musculoskeletal: Normal range of motion and neck supple. No neck rigidity.  Cardiovascular:     Rate and Rhythm: Regular rhythm. Tachycardia present.     Pulses: Normal pulses.     Heart sounds: No murmur.  Pulmonary:     Effort: Pulmonary effort is normal. No respiratory distress.     Breath sounds: Normal breath sounds.  Abdominal:     Tenderness: There is no abdominal tenderness.     Comments: Gravid abdomen  Musculoskeletal:        General: No swelling.     Right lower leg: No edema.     Left lower leg: No edema.  Skin:    General: Skin is warm and dry.  Neurological:     General: No focal deficit present.     Mental Status: She is alert. Mental status is at baseline.     Cranial Nerves: No cranial nerve deficit.     Sensory: No sensory deficit.     Motor: No weakness.     Comments: No clonus, no hyperreflexia   Psychiatric:        Mood and Affect: Mood normal.        Behavior: Behavior normal.      ED Treatments / Results  Labs (all labs ordered are listed, but only abnormal results are displayed) Labs Reviewed  BASIC METABOLIC PANEL - Abnormal; Notable for the following components:      Result Value   Sodium 132 (*)    CO2 18 (*)    Glucose, Bld 115  (*)    All other components within normal limits  CBC - Abnormal; Notable for the following components:   Hemoglobin 11.6 (*)    HCT 34.9 (*)    All other components within normal  limits  I-STAT BETA HCG BLOOD, ED ([facility], [facility], [facility] ONLY) - Abnormal; Notable for the following components:   I-stat hCG, quantitative >2,000.0 (*)    All other components within normal limits  D-DIMER, QUANTITATIVE (NOT AT [facility])  POC SARS CORONAVIRUS 2 AG -  ED  TROPONIN I (HIGH SENSITIVITY)    EKG EKG Interpretation  Date/Time:  Saturday September 11 2019 18:16:56 EST Ventricular Rate:  119 PR Interval:    QRS Duration: 84 QT Interval:  307 QTC Calculation: 432 R Axis:   75 Text Interpretation: Sinus tachycardia Consider left atrial enlargement Borderline T abnormalities, diffuse leads Baseline wander in lead(s) V4 V5 Confirmed by Elnora Morrison [phone]) on 09/11/2019 9:03:34 PM   Radiology Dg Chest Portable 1 View  Result Date: 09/11/2019 CLINICAL DATA:  Cough and congestion EXAM: PORTABLE CHEST 1 VIEW COMPARISON:  None. FINDINGS: The heart size and mediastinal contours are within normal limits. Both lungs are clear. The visualized skeletal structures are unremarkable. IMPRESSION: No active disease. Electronically Signed   By: Donavan Foil M.D.   On: 09/11/2019 19:02    Procedures Procedures (including critical care time)  Medications Ordered in ED Medications  sodium chloride flush (NS) 0.9 % injection 3 mL (3 mLs Intravenous Not Given 09/11/19 1803)  sodium chloride 0.9 % bolus 1,000 mL (0 mLs Intravenous Stopped 09/11/19 2214)  metoCLOPramide (REGLAN) injection 10 mg (10 mg Intravenous Given 09/11/19 1904)  diphenhydrAMINE (BENADRYL) injection 25 mg (25 mg Intravenous Given 09/11/19 1903)  acetaminophen (TYLENOL) tablet 1,000 mg (1,000 mg Oral Given 09/11/19 1902)  sodium chloride 0.9 % bolus 1,000 mL (0 mLs Intravenous Stopped 09/12/19 0017)     Initial Impression / Assessment and Plan / ED  Course  I have reviewed the triage vital signs and the nursing notes.  Pertinent labs & imaging results that were available during my care of the patient were reviewed by me and considered in my medical decision making (see chart for details).     On arrival, patient found to be tachycardic in triage to the 140s, at bedside, patient is tachycardic to the 110s; patient is in no acute respiratory distress and overall well-appearing.  Considered: Viral URI, COVID-19, pneumonia, ACS, PE; patient has headache without concerning features (no vision changes, no leg swelling, no clonus, no hyperreflexia, no hypertension to suggest preeclampsia). No fever to indicate concern for myocarditis.  Patient is [redacted] weeks gestation but denies any pregnancy complaints today.  Bedside ultrasound performed showing active fetal movement and fetal heart rate of 143.  EKG: Sinus tachycardia.  Of note, there is evidence of S1,q3,T3 CXR: No acute findings  BMP with CO2 of 18 likely related to dehydration.  Patient was given IV fluids with improvement of heart rate.  She states she has not been able to eat or drink as much today. Covid screen negative No leukocytosis  Hear score 2 Troponin negative, patient's chest pain is described as an aching sensation appears atypical for ACS.  Do not feel that further troponin or ACS work-up is necessary at this time as low clinical suspicion.  Upon reassessment, patient reports feeling better.  She continues to be well-appearing on room air.  Tachycardia has resolved with IVF and heart rate is in the 90s.  However, given patient's initial tachycardia as well as EKG findings, shared decision-making held regarding work up for possible PE.  Overall, in setting of body aches, headache, cough most likely viral URI.  We discussed the patient is at increased risk  for blood clots given her pregnancy.  After shared decision making, plan for obtaining D-dimer and use pregnancy adjusted  cutoffs then further discuss CT imaging.  Patient agreeable with plan.  D-dimer found to be nonelevated.  Upon final assessment, patient satting 100% on room air and heart rate is in the 90s.  She endorses feeling better with improved headache and body aches.  Overall, concern for viral URI.  Patient courage to stay home from work for the next few days.  Her rapid Covid screen was negative but she has a remaining Covid screen done earlier today that is still in process.  Patient voices understanding.  We discussed using Tylenol for pain control and close follow-up with her regular doctor.  Stable for discharge at this time.  Strict return precautions given.  Final Clinical Impressions(s) / ED Diagnoses   Final diagnoses:  Viral upper respiratory tract infection  Atypical chest pain    ED Discharge Orders    None       Burns Spain, MD 09/12/19 Smitty Knudsen, MD 09/12/19 [phone]

## 2019-09-11 NOTE — ED Triage Notes (Signed)
Pt reports she was exposed to a co worker who tested positive for COVID on Friday, pt started to develop cough and chest tightness over the weekend. Pt tested for COVID at health at work this morning but still waiting for results. Pt a.o, resp e.u at this time

## 2019-09-11 NOTE — ED Notes (Signed)
Pt is [redacted] weeks pregnant, c section scheduled in january

## 2019-09-12 NOTE — ED Notes (Signed)
Patient verbalizes understanding of discharge instructions. Opportunity for questioning and answers were provided. Armband removed by staff, pt discharged from ED.  

## 2019-09-12 NOTE — Discharge Instructions (Signed)
Please arrange follow up with you PCP. Please return to the ED if symptoms were to worsen or new concerning symptoms develop. Thank you for allowing [place] to care for you.

## 2019-09-17 ENCOUNTER — Other Ambulatory Visit: Payer: Self-pay

## 2019-09-17 ENCOUNTER — Emergency Department (HOSPITAL_COMMUNITY): Payer: 59

## 2019-09-17 ENCOUNTER — Encounter (HOSPITAL_COMMUNITY): Payer: Self-pay | Admitting: Emergency Medicine

## 2019-09-17 ENCOUNTER — Inpatient Hospital Stay (HOSPITAL_COMMUNITY)
Admission: EM | Admit: 2019-09-17 | Discharge: 2019-09-24 | DRG: 831 | Disposition: A | Discharge status: Home / Self Care | Payer: 59 | Attending: Internal Medicine | Admitting: Internal Medicine

## 2019-09-17 DIAGNOSIS — J9601 Acute respiratory failure with hypoxia: Secondary | ICD-10-CM | POA: Diagnosis present

## 2019-09-17 DIAGNOSIS — E871 Hypo-osmolality and hyponatremia: Secondary | ICD-10-CM | POA: Diagnosis present

## 2019-09-17 DIAGNOSIS — Z79899 Other long term (current) drug therapy: Secondary | ICD-10-CM | POA: Diagnosis not present

## 2019-09-17 DIAGNOSIS — D649 Anemia, unspecified: Secondary | ICD-10-CM | POA: Diagnosis present

## 2019-09-17 DIAGNOSIS — Z98891 History of uterine scar from previous surgery: Secondary | ICD-10-CM | POA: Diagnosis not present

## 2019-09-17 DIAGNOSIS — O99513 Diseases of the respiratory system complicating pregnancy, third trimester: Secondary | ICD-10-CM | POA: Diagnosis present

## 2019-09-17 DIAGNOSIS — O99283 Endocrine, nutritional and metabolic diseases complicating pregnancy, third trimester: Secondary | ICD-10-CM | POA: Diagnosis present

## 2019-09-17 DIAGNOSIS — O99013 Anemia complicating pregnancy, third trimester: Secondary | ICD-10-CM | POA: Diagnosis present

## 2019-09-17 DIAGNOSIS — R112 Nausea with vomiting, unspecified: Secondary | ICD-10-CM | POA: Diagnosis not present

## 2019-09-17 DIAGNOSIS — O09523 Supervision of elderly multigravida, third trimester: Secondary | ICD-10-CM | POA: Diagnosis not present

## 2019-09-17 DIAGNOSIS — R0902 Hypoxemia: Secondary | ICD-10-CM

## 2019-09-17 DIAGNOSIS — E861 Hypovolemia: Secondary | ICD-10-CM | POA: Diagnosis not present

## 2019-09-17 DIAGNOSIS — Z3A31 31 weeks gestation of pregnancy: Secondary | ICD-10-CM

## 2019-09-17 DIAGNOSIS — U071 COVID-19: Secondary | ICD-10-CM | POA: Diagnosis present

## 2019-09-17 DIAGNOSIS — O99613 Diseases of the digestive system complicating pregnancy, third trimester: Secondary | ICD-10-CM | POA: Diagnosis present

## 2019-09-17 DIAGNOSIS — Z9089 Acquired absence of other organs: Secondary | ICD-10-CM | POA: Diagnosis not present

## 2019-09-17 DIAGNOSIS — K219 Gastro-esophageal reflux disease without esophagitis: Secondary | ICD-10-CM | POA: Diagnosis present

## 2019-09-17 DIAGNOSIS — J1289 Other viral pneumonia: Secondary | ICD-10-CM | POA: Diagnosis present

## 2019-09-17 DIAGNOSIS — E876 Hypokalemia: Secondary | ICD-10-CM | POA: Diagnosis not present

## 2019-09-17 DIAGNOSIS — O98513 Other viral diseases complicating pregnancy, third trimester: Principal | ICD-10-CM | POA: Diagnosis present

## 2019-09-17 DIAGNOSIS — R0602 Shortness of breath: Secondary | ICD-10-CM | POA: Diagnosis not present

## 2019-09-17 DIAGNOSIS — Z3490 Encounter for supervision of normal pregnancy, unspecified, unspecified trimester: Secondary | ICD-10-CM

## 2019-09-17 HISTORY — DX: COVID-19: U07.1

## 2019-09-17 LAB — COMPREHENSIVE METABOLIC PANEL
ALT: 47 U/L — ABNORMAL HIGH (ref 0–44)
AST: 52 U/L — ABNORMAL HIGH (ref 15–41)
Albumin: 2.8 g/dL — ABNORMAL LOW (ref 3.5–5.0)
Alkaline Phosphatase: 183 U/L — ABNORMAL HIGH (ref 38–126)
Anion gap: 11 (ref 5–15)
BUN: 7 mg/dL (ref 6–20)
CO2: 19 mmol/L — ABNORMAL LOW (ref 22–32)
Calcium: 9.4 mg/dL (ref 8.9–10.3)
Chloride: 102 mmol/L (ref 98–111)
Creatinine, Ser: 0.83 mg/dL (ref 0.44–1.00)
GFR calc Af Amer: >60 mL/min (ref >60)
GFR calc non Af Amer: >60 mL/min (ref >60)
Glucose, Bld: 107 mg/dL — ABNORMAL HIGH (ref 70–99)
Potassium: 3.5 mmol/L (ref 3.5–5.1)
Sodium: 132 mmol/L — ABNORMAL LOW (ref 135–145)
Total Bilirubin: 1.2 mg/dL (ref 0.3–1.2)
Total Protein: 6.7 g/dL (ref 6.5–8.1)

## 2019-09-17 LAB — CBC WITH DIFFERENTIAL/PLATELET
Abs Immature Granulocytes: 0 10*3/uL (ref 0.00–0.07)
Basophils Absolute: 0 10*3/uL (ref 0.0–0.1)
Basophils Relative: 0 %
Eosinophils Absolute: 0 10*3/uL (ref 0.0–0.5)
Eosinophils Relative: 0 %
HCT: 40.4 % (ref 36.0–46.0)
Hemoglobin: 13.1 g/dL (ref 12.0–15.0)
Lymphocytes Relative: 45 %
Lymphs Abs: 2 10*3/uL (ref 0.7–4.0)
MCH: 27.3 pg (ref 26.0–34.0)
MCHC: 32.4 g/dL (ref 30.0–36.0)
MCV: 84.2 fL (ref 80.0–100.0)
Monocytes Absolute: 0.3 10*3/uL (ref 0.1–1.0)
Monocytes Relative: 7 %
Neutro Abs: 2.2 10*3/uL (ref 1.7–7.7)
Neutrophils Relative %: 48 %
Platelets: 231 10*3/uL (ref 150–400)
RBC: 4.8 MIL/uL (ref 3.87–5.11)
RDW: 12.4 % (ref 11.5–15.5)
WBC: 4.5 10*3/uL (ref 4.0–10.5)
nRBC: 0 % (ref 0.0–0.2)
nRBC: 0 /100 WBC

## 2019-09-17 LAB — LIPASE, BLOOD: Lipase: 36 U/L (ref 11–51)

## 2019-09-17 LAB — ABO/RH: ABO/RH(D): O POS

## 2019-09-17 LAB — POC SARS CORONAVIRUS 2 AG -  ED: SARS Coronavirus 2 Ag: POSITIVE — AB

## 2019-09-17 MED ORDER — SODIUM CHLORIDE 0.9 % IV SOLN
INTRAVENOUS | Status: AC
  Administered 2019-09-17: 22:00:00 via INTRAVENOUS

## 2019-09-17 MED ORDER — ONDANSETRON HCL 4 MG/2ML IJ SOLN
4.0000 mg | Freq: Once | INTRAMUSCULAR | Status: AC
  Administered 2019-09-17: 4 mg via INTRAVENOUS
  Filled 2019-09-17: qty 2

## 2019-09-17 MED ORDER — DEXAMETHASONE SODIUM PHOSPHATE 10 MG/ML IJ SOLN
6.0000 mg | Freq: Two times a day (BID) | INTRAMUSCULAR | Status: AC
  Administered 2019-09-17 – 2019-09-19 (×4): 6 mg via INTRAVENOUS
  Filled 2019-09-17 (×4): qty 1

## 2019-09-17 MED ORDER — LACTATED RINGERS IV BOLUS
1000.0000 mL | Freq: Once | INTRAVENOUS | Status: AC
  Administered 2019-09-17: 1000 mL via INTRAVENOUS

## 2019-09-17 MED ORDER — SODIUM CHLORIDE 0.9 % IV SOLN
INTRAVENOUS | Status: DC
  Administered 2019-09-17: 17:00:00 via INTRAVENOUS

## 2019-09-17 MED ORDER — METOCLOPRAMIDE HCL 5 MG/ML IJ SOLN
10.0000 mg | Freq: Once | INTRAMUSCULAR | Status: AC
  Administered 2019-09-17: 10 mg via INTRAVENOUS
  Filled 2019-09-17: qty 2

## 2019-09-17 MED ORDER — ONDANSETRON HCL 4 MG/2ML IJ SOLN
4.0000 mg | Freq: Four times a day (QID) | INTRAMUSCULAR | Status: DC | PRN
  Filled 2019-09-17: qty 2

## 2019-09-17 NOTE — ED Notes (Signed)
OB dr-- Linda Hedges

## 2019-09-17 NOTE — ED Notes (Signed)
Pt given water and crackers for PO challenge. Pt has tolerated for 20 minutes.

## 2019-09-17 NOTE — ED Provider Notes (Addendum)
[facility] EMERGENCY DEPARTMENT Provider Note   CSN: [id number] Arrival date & time: 09/17/19  1415     History   Chief Complaint Chief Complaint  Patient presents with   covid +   Emesis    HPI Julia Ibarra is a 43 y.o. G3P2 female who presents with 4-day history of emesis in the setting of 30-week pregnancy and COVID-19 infection.  Patient is unable to keep anything down over the past 2 days.  She denies any significant abdominal pain, but it does describe some mild cramping related to vomiting.  She denies any diarrhea, chest pain, fevers, urinary symptoms, vaginal bleeding.  Patient reports very mild shortness of breath on exertion and intermittent, dry cough.  These have not been very bothersome, but she is concerned about not being able to keep food or fluids down.  OB is Dr. Linda Hedges.     HPI  Past Medical History:  Diagnosis Date   Vitamin D deficiency 12/03/2017    Patient Active Problem List   Diagnosis Date Noted   Acute respiratory failure with hypoxia ([place]) 09/17/2019   Hypovolemia associated with vomiting 09/17/2019   Pregnancy 09/17/2019   COVID-19 virus infection 09/17/2019   Other fatigue 12/03/2017   Shortness of breath on exertion 12/03/2017   Elevated blood pressure reading 12/03/2017    Past Surgical History:  Procedure Laterality Date   CESAREAN SECTION     2   TONSILLECTOMY       OB History    Gravida  3   Para  2   Term      Preterm      AB      Living        SAB      TAB      Ectopic      Multiple      Live Births  2            Home Medications    Prior to Admission medications   Medication Sig Start Date End Date Taking? Authorizing Provider  acetaminophen (TYLENOL) 500 MG tablet Take 500 mg by mouth every 4 (four) hours as needed for fever or headache (pain).   Yes [provider]  Prenatal Vit-Fe Fumarate-FA (PRENATAL MULTIVITAMIN) TABS tablet Take 1 tablet by  mouth daily at 12 noon.   Yes [provider]  Lorcaserin HCl ER (BELVIQ XR) 20 MG TB24 Take 1 tablet by mouth daily after breakfast. Patient not taking: Reported on 09/11/2019 07/07/18   Nche, Charlene Brooke, NP  vitamin B-12 (CYANOCOBALAMIN) 1000 MCG tablet Take 2 tablets (2,000 mcg total) by mouth daily. Patient not taking: Reported on 07/07/2018 12/17/17   Eber Jones, MD  Vitamin D, Cholecalciferol, 1000 units CAPS Take 1 capsule by mouth daily after breakfast. Patient not taking: Reported on 09/11/2019 07/07/18   Nche, Charlene Brooke, NP    Family History Family History  Problem Relation Age of Onset   Depression Mother    Anxiety disorder Mother    Bipolar disorder Mother    Alcoholism Mother    Hypertension Maternal Grandmother    Cancer Maternal Grandmother        cervical cancer    Social History Social History   Tobacco Use   Smoking status: Never Smoker   Smokeless tobacco: Never Used  Substance Use Topics   Alcohol use: Yes    Alcohol/week: 1.0 - 2.0 standard drinks    Types: 1 - 2 Glasses  of wine per week    Comment: On the weekends   Drug use: No     Allergies   Patient has no known allergies.   Review of Systems Review of Systems  Constitutional: Negative for chills and fever.  HENT: Negative for facial swelling and sore throat.   Respiratory: Positive for cough (mild, dry) and shortness of breath (mild).   Cardiovascular: Negative for chest pain.  Gastrointestinal: Positive for abdominal pain (cramping only), nausea and vomiting. Negative for diarrhea.  Genitourinary: Negative for dysuria.  Musculoskeletal: Negative for back pain.  Skin: Negative for rash and wound.  Neurological: Negative for headaches.  Psychiatric/Behavioral: The patient is not nervous/anxious.      Physical Exam Updated Vital Signs BP 118/75    Pulse (!) 103    Temp 98.9 F (37.2 C) (Oral)    Resp 20    Wt 113 kg    LMP  (Exact Date)    SpO2 100%     BMI 40.21 kg/m   Physical Exam Vitals signs and nursing note reviewed.  Constitutional:      General: She is not in acute distress.    Appearance: She is well-developed. She is not diaphoretic.  HENT:     Head: Normocephalic and atraumatic.     Mouth/Throat:     Pharynx: No oropharyngeal exudate.  Eyes:     General: No scleral icterus.       Right eye: No discharge.        Left eye: No discharge.     Conjunctiva/sclera: Conjunctivae normal.     Pupils: Pupils are equal, round, and reactive to light.  Neck:     Musculoskeletal: Normal range of motion and neck supple.     Thyroid: No thyromegaly.  Cardiovascular:     Rate and Rhythm: Normal rate and regular rhythm.     Heart sounds: Normal heart sounds. No murmur. No friction rub. No gallop.   Pulmonary:     Effort: Pulmonary effort is normal. No respiratory distress.     Breath sounds: Normal breath sounds. No stridor. No wheezing or rales.  Abdominal:     General: Bowel sounds are normal. There is no distension.     Palpations: Abdomen is soft.     Tenderness: There is no abdominal tenderness. There is no guarding or rebound.  Lymphadenopathy:     Cervical: No cervical adenopathy.  Skin:    General: Skin is warm and dry.     Coloration: Skin is not pale.     Findings: No rash.  Neurological:     Mental Status: She is alert.     Coordination: Coordination normal.      ED Treatments / Results  Labs (all labs ordered are listed, but only abnormal results are displayed) Labs Reviewed  COMPREHENSIVE METABOLIC PANEL - Abnormal; Notable for the following components:      Result Value   Sodium 132 (*)    CO2 19 (*)    Glucose, Bld 107 (*)    Albumin 2.8 (*)    AST 52 (*)    ALT 47 (*)    Alkaline Phosphatase 183 (*)    All other components within normal limits  POC SARS CORONAVIRUS 2 AG -  ED - Abnormal; Notable for the following components:   SARS Coronavirus 2 Ag POSITIVE (*)    All other components within normal  limits  CBC WITH DIFFERENTIAL/PLATELET  LIPASE, BLOOD  CBC WITH DIFFERENTIAL/PLATELET  COMPREHENSIVE METABOLIC PANEL  D-DIMER, QUANTITATIVE (NOT AT [facility])  C-REACTIVE PROTEIN  PHOSPHORUS  FERRITIN  MAGNESIUM  ABO/RH    EKG None  Radiology Dg Chest Portable 1 View  Result Date: 09/17/2019 CLINICAL DATA:  Shortness of breath.  COVID-19 positive. EXAM: PORTABLE CHEST 1 VIEW COMPARISON:  September 11, 2019. FINDINGS: The heart size and mediastinal contours are within normal limits. Both lungs are clear. No pneumothorax or pleural effusion is noted. The visualized skeletal structures are unremarkable. IMPRESSION: No active disease. Electronically Signed   By: Marijo Conception M.D.   On: 09/17/2019 20:14    Procedures .Critical Care Performed by: Frederica Kuster, PA-C Authorized by: Frederica Kuster, PA-C   Critical care provider statement:    Critical care time (minutes):  45   Critical care was necessary to treat or prevent imminent or life-threatening deterioration of the following conditions:  Respiratory failure   Critical care was time spent personally by me on the following activities:  Discussions with consultants, evaluation of patient's response to treatment, examination of patient, ordering and performing treatments and interventions, ordering and review of laboratory studies, ordering and review of radiographic studies, pulse oximetry, re-evaluation of patient's condition, obtaining history from patient or surrogate and review of old charts   I assumed direction of critical care for this patient from another provider in my specialty: no     (including critical care time)  Medications Ordered in ED Medications  0.9 %  sodium chloride infusion ( Intravenous Stopped 09/17/19 2135)  dexamethasone (DECADRON) injection 6 mg (6 mg Intravenous Given 09/17/19 2120)  ondansetron (ZOFRAN) injection 4 mg (has no administration in time range)  0.9 %  sodium chloride infusion (  Intravenous New Bag/Given 09/17/19 2138)  ondansetron (ZOFRAN) injection 4 mg (4 mg Intravenous Given 09/17/19 1552)  lactated ringers bolus 1,000 mL (0 mLs Intravenous Stopped 09/17/19 1807)  metoCLOPramide (REGLAN) injection 10 mg (10 mg Intravenous Given 09/17/19 1808)  lactated ringers bolus 1,000 mL (0 mLs Intravenous Stopped 09/17/19 1948)     Initial Impression / Assessment and Plan / ED Course  I have reviewed the triage vital signs and the nursing notes.  Pertinent labs & imaging results that were available during my care of the patient were reviewed by me and considered in my medical decision making (see chart for details).  Clinical Course as of Sep 17 2239  Fri Sep 17, 2019  1554 Rapid OB performed NST and patient is cleared from [facility] perspective.  OB permits antiemetic to control patient's vomiting.   [AL]    Clinical Course User Index [AL] Frederica Kuster, PA-C       30-week pregnant patient known Covid positive presenting with intractable vomiting at home.  Her labs are stable.  Her emesis was controlled ultimately with Reglan.  Zofran left the patient still little nauseated.  During her stay in the ED her tachycardia was difficult to control despite 2 L of lactated Ringer's.  It was then discovered that patient was hypoxic, although denying shortness of breath.  Her oxygen saturations were in the mid 80s.  She was placed on 2 L which brought her up to upper 90s to 100%.  Considering patient's new hypoxia, will admit for further management of her COVID-19 infection.  I discussed patient case with Dr. Myna Hidalgo with [facility] who accepts patient for admission.  I also discussed patient case with OB/GYN on-call for patient's practice, Dr. Royston Sinner, who will evaluate the patient in the morning.  She recommends twice  daily NST, Decadron 6 mg every 12 hours for 2 days.  She also advised monitoring for regular contractions, leaking fluids, as well as making sure there 10 fetal movements every 2  hours.  I appreciate the above consultants for their assistance with the patient.  Patient is in agreement with plan.  RAYSSA STRAMEL was evaluated in Emergency Department on 09/17/2019 for the symptoms described in the history of present illness. She was evaluated in the context of the global COVID-19 pandemic, which necessitated consideration that the patient might be at risk for infection with the SARS-CoV-2 virus that causes COVID-19. Institutional protocols and algorithms that pertain to the evaluation of patients at risk for COVID-19 are in a state of rapid change based on information released by regulatory bodies including the CDC and federal and state organizations. These policies and algorithms were followed during the patient's care in the ED.   Final Clinical Impressions(s) / ED Diagnoses   Final diagnoses:  [id number] virus infection  Hypoxia  Non-intractable vomiting with nausea, unspecified vomiting type    ED Discharge Orders    None       Frederica Kuster, PA-C 09/17/19 2240    Lucrezia Starch, MD 09/18/19 [address] [place], PA-C 09/27/19 [id number]    Lucrezia Starch, MD 09/27/19 [phone]

## 2019-09-17 NOTE — Progress Notes (Signed)
Dr Royston Sinner notified of reactive NST and that rn had done prolonged monitoring just to verify baseline.  MD says that pt may have antiemetics from ED and go home.  Pt cleared obstetrically.

## 2019-09-17 NOTE — Progress Notes (Signed)
Dr Royston Sinner notified that pt is here at [place] [facility] with nausea and vomiting x3 days after testing positive for covid 1 week ago.  MD notified that pt has had abdominal cramping with the vomiting but no leaking of fluid or vaginal bleeding. PT is G3P3 with hx of twins and has a scheduled csection with Dr Lynnette Caffey. Dr Royston Sinner gives order for NST.

## 2019-09-17 NOTE — ED Notes (Signed)
Pt has dry, hacking cough--

## 2019-09-17 NOTE — ED Notes (Signed)
Paged ob rapid response.

## 2019-09-17 NOTE — Progress Notes (Signed)
Called to admit Julia Ibarra for acute hypoxic respiratory failure in setting of COVID-19 infection and pregnancy at 31w 1d.   Plan was to admit to [facility] with OBGYN consulting.   There was report that she tested positive for COVID-19 on 11/21 but that test was actually negative.   She had been exposed to a coworker with COVID-19, has been experiencing SOB.   Plan to check rapid COVID test now and admit as PUI.

## 2019-09-17 NOTE — H&P (Signed)
History and Physical    KINGSLEE OGIER [id number] DOB: [birth year] DOA: 09/17/2019  PCP: Flossie Buffy, NP   Patient coming from: Home   Chief Complaint: N/V, SOB, COVID exposure  HPI: Julia Ibarra is a 43 y.o. female with medical history significant for pregnancy at 31 weeks and 1 day, reports recent Covid exposure, and now presents with nausea, vomiting, and shortness of breath.  Patient reports that she was exposed to a coworker who tested positive for COVID-19.  She went on to develop cough, general aches and malaise, and mild dyspnea; the symptoms seem to be improving when she developed nausea with recurrent nonbloody vomiting 3 days ago.  Over the past 3 days, she has had ongoing vomiting, has been unable to keep anything down, and has been short of breath with any exertion.  She denies abdominal pain or diarrhea.  She denies leg swelling or tenderness.  She denies chest pain.  ED Course: Upon arrival to the ED, patient is found to be afebrile, saturating mid 80s on room air, mildly tachypneic, tachycardic in the 110s, and with stable blood pressure.  Chest x-ray is negative for acute cardiopulmonary disease.  Chemistry panel is notable for mild hyponatremia and slight elevation in transaminases.  CBC is unremarkable.  Patient was given 2 L of lactated Ringer's, Reglan, and Zofran in the ED.  COVID-19 antigen test is positive.  OB/GYN was consulted by the ED physician.  Review of Systems:  All other systems reviewed and apart from HPI, are negative.  Past Medical History:  Diagnosis Date  . Vitamin D deficiency 12/03/2017    Past Surgical History:  Procedure Laterality Date  . CESAREAN SECTION     2  . TONSILLECTOMY       reports that she has never smoked. She has never used smokeless tobacco. She reports current alcohol use of about 1.0 - 2.0 standard drinks of alcohol per week. She reports that she does not use drugs.  No Known Allergies  Family History   Problem Relation Age of Onset  . Depression Mother   . Anxiety disorder Mother   . Bipolar disorder Mother   . Alcoholism Mother   . Hypertension Maternal Grandmother   . Cancer Maternal Grandmother        cervical cancer     Prior to Admission medications   Medication Sig Start Date End Date Taking? Authorizing Provider  acetaminophen (TYLENOL) 500 MG tablet Take 500 mg by mouth every 4 (four) hours as needed for fever or headache (pain).   Yes [provider]  Prenatal Vit-Fe Fumarate-FA (PRENATAL MULTIVITAMIN) TABS tablet Take 1 tablet by mouth daily at 12 noon.   Yes [provider]  Lorcaserin HCl ER (BELVIQ XR) 20 MG TB24 Take 1 tablet by mouth daily after breakfast. Patient not taking: Reported on 09/11/2019 07/07/18   Nche, Charlene Brooke, NP  vitamin B-12 (CYANOCOBALAMIN) 1000 MCG tablet Take 2 tablets (2,000 mcg total) by mouth daily. Patient not taking: Reported on 07/07/2018 12/17/17   Eber Jones, MD  Vitamin D, Cholecalciferol, 1000 units CAPS Take 1 capsule by mouth daily after breakfast. Patient not taking: Reported on 09/11/2019 07/07/18   Flossie Buffy, NP    Physical Exam: Vitals:   09/17/19 2102 09/17/19 2103 09/17/19 2115 09/17/19 2130  BP:   113/81 124/79  Pulse: 95 (!) 103 (!) 109 99  Resp: (!) 21 17 (!) 23 20  Temp:      TempSrc:  SpO2: 100% 100% 97% 97%  Weight:        Constitutional: NAD, calm  Eyes: PERTLA, lids and conjunctivae normal ENMT: Mucous membranes are moist. Posterior pharynx clear of any exudate or lesions.   Neck: normal, supple, no masses, no thyromegaly Respiratory: Mild tachypnea, speaking full sentences. No pallor or cyanosis.   Cardiovascular: rate ~110 and regular. No extremity edema.    Abdomen: gravid uterus, soft, nontender. Bowel sounds active.  Musculoskeletal: no clubbing / cyanosis. No joint deformity upper and lower extremities.  Skin: no significant rashes, lesions, ulcers. Warm, dry,  well-perfused. Neurologic: No facial asymmetry. Sensation intact. Moving all extremities.  Psychiatric: Alert and oriented to person, place, and situation. Pleasant, cooperative.      Labs on Admission: I have personally reviewed following labs and imaging studies  CBC: Recent Labs  Lab 09/11/19 1747 09/17/19 1452  WBC 7.4 4.5  NEUTROABS  --  2.2  HGB 11.6* 13.1  HCT 34.9* 40.4  MCV 84.7 84.2  PLT 233 [id number]   Basic Metabolic Panel: Recent Labs  Lab 09/11/19 1747 09/17/19 1452  NA 132* 132*  K 3.5 3.5  CL 104 102  CO2 18* 19*  GLUCOSE 115* 107*  BUN 6 7  CREATININE 0.70 0.83  CALCIUM 9.8 9.4   GFR: Estimated Creatinine Clearance: 113.8 mL/min (by C-G formula based on SCr of 0.83 mg/dL). Liver Function Tests: Recent Labs  Lab 09/17/19 1452  AST 52*  ALT 47*  ALKPHOS 183*  BILITOT 1.2  PROT 6.7  ALBUMIN 2.8*   Recent Labs  Lab 09/17/19 1452  LIPASE 36   No results for input(s): AMMONIA in the last 168 hours. Coagulation Profile: No results for input(s): INR, PROTIME in the last 168 hours. Cardiac Enzymes: No results for input(s): CKTOTAL, CKMB, CKMBINDEX, TROPONINI in the last 168 hours. BNP (last 3 results) No results for input(s): PROBNP in the last 8760 hours. HbA1C: No results for input(s): HGBA1C in the last 72 hours. CBG: No results for input(s): GLUCAP in the last 168 hours. Lipid Profile: No results for input(s): CHOL, HDL, LDLCALC, TRIG, CHOLHDL, LDLDIRECT in the last 72 hours. Thyroid Function Tests: No results for input(s): TSH, T4TOTAL, FREET4, T3FREE, THYROIDAB in the last 72 hours. Anemia Panel: No results for input(s): VITAMINB12, FOLATE, FERRITIN, TIBC, IRON, RETICCTPCT in the last 72 hours. Urine analysis: No results found for: COLORURINE, APPEARANCEUR, LABSPEC, PHURINE, GLUCOSEU, HGBUR, BILIRUBINUR, KETONESUR, PROTEINUR, UROBILINOGEN, NITRITE, LEUKOCYTESUR Sepsis Labs: @LABRCNTIP (procalcitonin:4,lacticidven:4) )No results found for  this or any previous visit (from the past 240 hour(s)).   Radiological Exams on Admission: Dg Chest Portable 1 View  Result Date: 09/17/2019 CLINICAL DATA:  Shortness of breath.  COVID-19 positive. EXAM: PORTABLE CHEST 1 VIEW COMPARISON:  September 11, 2019. FINDINGS: The heart size and mediastinal contours are within normal limits. Both lungs are clear. No pneumothorax or pleural effusion is noted. The visualized skeletal structures are unremarkable. IMPRESSION: No active disease. Electronically Signed   By: Marijo Conception M.D.   On: 09/17/2019 20:14    EKG: Not performed.   Assessment/Plan   1. COVID-19 infection; acute hypoxic respiratory infection  - Presents with 3 days N/V and SOB, reports COVID exposure at work, has positive antigen test in ED, is tachypneic at rest with O2 saturation mid-80's  - Continue supplemental O2, start Decadron, check/trend inflammatory markers, continue isolation, she is candidate for remdesivir if okay with OBBGYN    2. Nausea, vomiting  - Patient is 31w 1d pregnant, presenting  with 3 days of N/V and unable to tolerate oral intake  - She has improved with IVF, Zofran, and Reglan in ED  - Start Decadron, continue as-needed Zofran, IVF hydration, monitor electrolytes    3. Pregnancy  - Patient is at [place] 1d, p/w N/V and SOB, reports abdominal cramping but no fluid leaking or bleeding  - She had reactive NST in ED  - OBGYN consulting and much appreciated     PPE: CAPR, gown, gloves  DVT prophylaxis: sq heparin  Code Status: Full  Family Communication: Discussed with patient  Consults called: OBGYN  Admission status: Inpatient    Vianne Bulls, MD [facility] Pager [phone]  If 7PM-7AM, please contact night-coverage [email or web address] Password [facility]  09/17/2019, 10:04 PM

## 2019-09-17 NOTE — ED Notes (Signed)
Alex, PA notified about pt continuing tachycardia.

## 2019-09-17 NOTE — ED Triage Notes (Signed)
From home, dx with covid on 11/21-- works for [employer]. Unable to keep anything down x 3 days-- no diarrhea. [redacted] weeks pregnant, Rapid OB at bedside

## 2019-09-17 NOTE — ED Notes (Signed)
Pt O2 saturation 87%, pt placed on 2L nasal canula.

## 2019-09-18 LAB — CBC WITH DIFFERENTIAL/PLATELET
Abs Immature Granulocytes: 0.09 10*3/uL — ABNORMAL HIGH (ref 0.00–0.07)
Basophils Absolute: 0 10*3/uL (ref 0.0–0.1)
Basophils Relative: 0 %
Eosinophils Absolute: 0 10*3/uL (ref 0.0–0.5)
Eosinophils Relative: 0 %
HCT: 33.1 % — ABNORMAL LOW (ref 36.0–46.0)
Hemoglobin: 10.9 g/dL — ABNORMAL LOW (ref 12.0–15.0)
Immature Granulocytes: 2 %
Lymphocytes Relative: 38 %
Lymphs Abs: 1.5 10*3/uL (ref 0.7–4.0)
MCH: 27.7 pg (ref 26.0–34.0)
MCHC: 32.9 g/dL (ref 30.0–36.0)
MCV: 84.2 fL (ref 80.0–100.0)
Monocytes Absolute: 0.2 10*3/uL (ref 0.1–1.0)
Monocytes Relative: 4 %
Neutro Abs: 2.2 10*3/uL (ref 1.7–7.7)
Neutrophils Relative %: 56 %
Platelets: 200 10*3/uL (ref 150–400)
RBC: 3.93 MIL/uL (ref 3.87–5.11)
RDW: 12.6 % (ref 11.5–15.5)
WBC: 4 10*3/uL (ref 4.0–10.5)
nRBC: 0 % (ref 0.0–0.2)

## 2019-09-18 LAB — COMPREHENSIVE METABOLIC PANEL
ALT: 38 U/L (ref 0–44)
AST: 47 U/L — ABNORMAL HIGH (ref 15–41)
Albumin: 2.2 g/dL — ABNORMAL LOW (ref 3.5–5.0)
Alkaline Phosphatase: 141 U/L — ABNORMAL HIGH (ref 38–126)
Anion gap: 10 (ref 5–15)
BUN: 6 mg/dL (ref 6–20)
CO2: 18 mmol/L — ABNORMAL LOW (ref 22–32)
Calcium: 8.9 mg/dL (ref 8.9–10.3)
Chloride: 105 mmol/L (ref 98–111)
Creatinine, Ser: 0.79 mg/dL (ref 0.44–1.00)
GFR calc Af Amer: >60 mL/min (ref >60)
GFR calc non Af Amer: >60 mL/min (ref >60)
Glucose, Bld: 120 mg/dL — ABNORMAL HIGH (ref 70–99)
Potassium: 3.8 mmol/L (ref 3.5–5.1)
Sodium: 133 mmol/L — ABNORMAL LOW (ref 135–145)
Total Bilirubin: 1.6 mg/dL — ABNORMAL HIGH (ref 0.3–1.2)
Total Protein: 5.1 g/dL — ABNORMAL LOW (ref 6.5–8.1)

## 2019-09-18 LAB — MAGNESIUM: Magnesium: 1.7 mg/dL (ref 1.7–2.4)

## 2019-09-18 LAB — C-REACTIVE PROTEIN: CRP: 0.9 mg/dL (ref <1.0)

## 2019-09-18 LAB — D-DIMER, QUANTITATIVE: D-Dimer, Quant: 0.63 ug/mL-FEU — ABNORMAL HIGH (ref 0.00–0.50)

## 2019-09-18 LAB — PHOSPHORUS: Phosphorus: 3.1 mg/dL (ref 2.5–4.6)

## 2019-09-18 LAB — FERRITIN: Ferritin: 59 ng/mL (ref 11–307)

## 2019-09-18 MED ORDER — ACETAMINOPHEN 650 MG RE SUPP: 650.0000 mg | Freq: Four times a day (QID) | RECTAL | Status: DC | PRN

## 2019-09-18 MED ORDER — ACETAMINOPHEN 325 MG PO TABS
650.0000 mg | ORAL_TABLET | Freq: Four times a day (QID) | ORAL | Status: DC | PRN
  Administered 2019-09-20 – 2019-09-21 (×3): 650 mg via ORAL
  Filled 2019-09-18 (×4): qty 2

## 2019-09-18 MED ORDER — MAGNESIUM SULFATE 2 GM/50ML IV SOLN: 2.0000 g | Freq: Once | INTRAVENOUS | Status: DC

## 2019-09-18 MED ORDER — SODIUM CHLORIDE 0.9% FLUSH
3.0000 mL | Freq: Two times a day (BID) | INTRAVENOUS | Status: DC
  Administered 2019-09-18 – 2019-09-24 (×12): 3 mL via INTRAVENOUS

## 2019-09-18 MED ORDER — HEPARIN SODIUM (PORCINE) 5000 UNIT/ML IJ SOLN
5000.0000 [IU] | Freq: Three times a day (TID) | INTRAMUSCULAR | Status: DC
  Administered 2019-09-18 – 2019-09-22 (×13): 5000 [IU] via SUBCUTANEOUS
  Filled 2019-09-18 (×13): qty 1

## 2019-09-18 MED ORDER — PRENATAL MULTIVITAMIN CH
1.0000 | ORAL_TABLET | Freq: Every day | ORAL | Status: DC
  Administered 2019-09-18 – 2019-09-23 (×6): 1 via ORAL
  Filled 2019-09-18 (×7): qty 1

## 2019-09-18 NOTE — Progress Notes (Signed)
PROGRESS NOTE    Julia Ibarra  [id number] DOB: 06/11/1976 DOA: 09/17/2019 PCP: Flossie Buffy, NP   Brief Narrative: 43 year old female with the no past medical history, 31-week pregnant comes to the emergency department with complaints of nausea, vomiting, cough, shortness of breath.  Patient was exposed to a coworker who tested positive for COVID-19.  Has been experiencing generalized body aches, malaise.  She continues to have vomiting, unable to keep anything down.  This is also associated with the shortness of breath with any exertion.  Work-up in the emergency department patient was having oxygen saturations in the mid 80s on room air, tachycardic, tachypneic.  Chest x-ray is negative for acute cardiopulmonary disease.  BMP significant for mild hyponatremia and slight elevation of the transaminases.  Patient was given 2 L of Ringer lactate, Reglan, and Zofran in the emergency department.  COVID-19 came back to be positive.  OB/GYN is consulted, recommended to continue with the dexamethasone 6 mg 2 times a day, continue with remdesivir.  Assessment & Plan:   Principal Problem:   Acute respiratory failure with hypoxia (HCC) Active Problems:   Hypovolemia associated with vomiting   Pregnancy   COVID-19 virus infection   ##Nausea and vomiting -Continue with antinausea medications -Continue with IV fluids -Continue with regular diet  ##Acute hypoxic respiratory failure -Secondary to COVID-19 pneumonia -Continue with the oxygen through nasal cannula to keep O2 sats greater than 92%  ##COVID-19 pneumonia -Continue with the dexamethasone 6 mg 2 times a day, remdesivir -Continue with isolation precautions -Closely follow-up with inflammatory markers  ##Pregnancy -Appreciate OB follow-up -Recommended to continue with the NST 2 times a day   DVT prophylaxis: Heparin Code Status: Full code  family Communication: Patient  disposition Plan: Pending clinical improvement   Consultants:  OB/GYN  Procedures: NST  Antimicrobials: Remdesivir 09/18/2019  Subjective: Complaining of cough, severe generalized weakness. Objective: Vitals:   09/18/19 1400 09/18/19 1430 09/18/19 1500 09/18/19 1515  BP: 115/68  113/73   Pulse: 79 83  82  Resp: 18 19  18   Temp:      TempSrc:      SpO2: 100% 97%  99%  Weight:       No intake or output data in the 24 hours ending 09/18/19 1702 Filed Weights   09/17/19 1438  Weight: 113 kg    Examination:  General exam: Appears calm and comfortable  Respiratory system: Clear to auscultation. Respiratory effort normal. Cardiovascular system: S1 & S2 heard, RRR. No JVD, murmurs, rubs, gallops or clicks. No pedal edema. Gastrointestinal system: Abdomen is nondistended, soft and nontender. No organomegaly or masses felt. Normal bowel sounds heard. Central nervous system: Alert and oriented. No focal neurological deficits. Extremities: Symmetric 5 x 5 power. Skin: No rashes, lesions or ulcers Psychiatry: Judgement and insight appear normal. Mood & affect appropriate.     Data Reviewed: I have personally reviewed following labs and imaging studies  CBC: Recent Labs  Lab 09/11/19 1747 09/17/19 1452 09/18/19 0555  WBC 7.4 4.5 4.0  NEUTROABS  --  2.2 2.2  HGB 11.6* 13.1 10.9*  HCT 34.9* 40.4 33.1*  MCV 84.7 84.2 84.2  PLT 233 231 [id number]   Basic Metabolic Panel: Recent Labs  Lab 09/11/19 1747 09/17/19 1452 09/18/19 0555  NA 132* 132* 133*  K 3.5 3.5 3.8  CL 104 102 105  CO2 18* 19* 18*  GLUCOSE 115* 107* 120*  BUN 6 7 6   CREATININE 0.70 0.83 0.79  CALCIUM 9.8 9.4  8.9  MG  --   --  1.7  PHOS  --   --  3.1   GFR: Estimated Creatinine Clearance: 118 mL/min (by C-G formula based on SCr of 0.79 mg/dL). Liver Function Tests: Recent Labs  Lab 09/17/19 1452 09/18/19 0555  AST 52* 47*  ALT 47* 38  ALKPHOS 183* 141*  BILITOT 1.2 1.6*  PROT 6.7 5.1*  ALBUMIN 2.8* 2.2*   Recent Labs  Lab 09/17/19 1452   LIPASE 36   No results for input(s): AMMONIA in the last 168 hours. Coagulation Profile: No results for input(s): INR, PROTIME in the last 168 hours. Cardiac Enzymes: No results for input(s): CKTOTAL, CKMB, CKMBINDEX, TROPONINI in the last 168 hours. BNP (last 3 results) No results for input(s): PROBNP in the last 8760 hours. HbA1C: No results for input(s): HGBA1C in the last 72 hours. CBG: No results for input(s): GLUCAP in the last 168 hours. Lipid Profile: No results for input(s): CHOL, HDL, LDLCALC, TRIG, CHOLHDL, LDLDIRECT in the last 72 hours. Thyroid Function Tests: No results for input(s): TSH, T4TOTAL, FREET4, T3FREE, THYROIDAB in the last 72 hours. Anemia Panel: Recent Labs    09/18/19 0555  FERRITIN 59   Sepsis Labs: No results for input(s): PROCALCITON, LATICACIDVEN in the last 168 hours.  No results found for this or any previous visit (from the past 240 hour(s)).       Radiology Studies: Dg Chest Portable 1 View  Result Date: 09/17/2019 CLINICAL DATA:  Shortness of breath.  COVID-19 positive. EXAM: PORTABLE CHEST 1 VIEW COMPARISON:  September 11, 2019. FINDINGS: The heart size and mediastinal contours are within normal limits. Both lungs are clear. No pneumothorax or pleural effusion is noted. The visualized skeletal structures are unremarkable. IMPRESSION: No active disease. Electronically Signed   By: Marijo Conception M.D.   On: 09/17/2019 20:14        Scheduled Meds: . dexamethasone (DECADRON) injection  6 mg Intravenous Q12H  . heparin  5,000 Units Subcutaneous Q8H  . prenatal multivitamin  1 tablet Oral Q1200  . sodium chloride flush  3 mL Intravenous Q12H   Continuous Infusions: . sodium chloride Stopped (09/17/19 2135)     LOS: 1 day    Time spent: 76 minutes   Fredric Slabach, MD [facility] Pager [phone]  If 7PM-7AM, please contact night-coverage [email or web address] Password [facility] 09/18/2019, 5:02 PM

## 2019-09-18 NOTE — ED Notes (Signed)
Lunch Tray Ordered @ 1029. 

## 2019-09-18 NOTE — Consult Note (Signed)
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 Julia Ibarra is 30.5 wga and her pregnancy thus far has been uncomplicated overall. 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 Was tested 11/21 with negative test. Tested positive in the ED. Originally presented with N/V which is now improved. Since arriving to the ED Julia Ibarra has been able to tolerate food and drink. Reports no emesis or nausea in the last several hours. Julia Ibarra says Julia Ibarra is surprised by the desaturations as Julia Ibarra does not have any SOB. Julia Ibarra continues to have a cough.   Pertinent Gynecological History: [id number]  G1: 2004 CS for PTL at 64 wga with twins G2: 2007 RCS G3: current. Normal genetic testing. Planning rCS. AMA.   Menstrual History: No LMP recorded (exact date). Patient is pregnant.    Past Medical History:  Diagnosis Date  . Vitamin D deficiency 12/03/2017    Past Surgical History:  Procedure Laterality Date  . CESAREAN SECTION     2  . TONSILLECTOMY      Family History  Problem Relation Age of Onset  . Depression Mother   . Anxiety disorder Mother   . Bipolar disorder Mother   . Alcoholism Mother   . Hypertension Maternal Grandmother   . Cancer Maternal Grandmother        cervical cancer    Social History:  reports that Julia Ibarra has never smoked. Julia Ibarra has never used smokeless tobacco. Julia Ibarra reports current alcohol use of about 1.0 - 2.0 standard drinks of alcohol per week. Julia Ibarra reports that Julia Ibarra does not use drugs.  Allergies: No Known Allergies   ROS  Blood pressure 115/74, pulse 86, temperature 98.9 F (37.2 C), temperature source Oral, resp. rate 19, weight 113 kg, SpO2 100 %. Physical Exam  NAD, A&O, on 2L [place] Abd soft, nondistended, gravid   Results for orders placed or performed during the hospital encounter of 09/17/19 (from the past 48 hour(s))  CBC with Differential      Status: None   Collection Time: 09/17/19  2:52 PM  Result Value Ref Range   WBC 4.5 4.0 - 10.5 K/uL   RBC 4.80 3.87 - 5.11 MIL/uL   Hemoglobin 13.1 12.0 - 15.0 g/dL   HCT 40.4 36.0 - 46.0 %   MCV 84.2 80.0 - 100.0 fL   MCH 27.3 26.0 - 34.0 pg   MCHC 32.4 30.0 - 36.0 g/dL   RDW 12.4 11.5 - 15.5 %   Platelets 231 150 - 400 K/uL   nRBC 0.0 0.0 - 0.2 %   Neutrophils Relative % 48 %   Neutro Abs 2.2 1.7 - 7.7 K/uL   Lymphocytes Relative 45 %   Lymphs Abs 2.0 0.7 - 4.0 K/uL   Monocytes Relative 7 %   Monocytes Absolute 0.3 0.1 - 1.0 K/uL   Eosinophils Relative 0 %   Eosinophils Absolute 0.0 0.0 - 0.5 K/uL   Basophils Relative 0 %   Basophils Absolute 0.0 0.0 - 0.1 K/uL   WBC Morphology FEW ATYPICAL LYMPHS NOTED    RBC Morphology FEW BURR CELLS AND ACANTHOCYTES NOTED    nRBC 0 0 /100 WBC   Abs Immature Granulocytes 0.00 0.00 - 0.07 K/uL    Comment: Performed at [facility], [address], [place], [place] [address]  CMET     Status: Abnormal   Collection Time: 09/17/19  2:52 PM  Result Value Ref Range   Sodium 132 (L) 135 - 145 mmol/L   Potassium 3.5 3.5 - 5.1 mmol/L   Chloride 102 98 - 111 mmol/L   CO2 19 (L) 22 - 32 mmol/L   Glucose, Bld 107 (H) 70 - 99 mg/dL   BUN 7 6 - 20 mg/dL   Creatinine, Ser 0.83 0.44 - 1.00 mg/dL   Calcium 9.4 8.9 - 10.3 mg/dL   Total Protein 6.7 6.5 - 8.1 g/dL   Albumin 2.8 (L) 3.5 - 5.0 g/dL   AST 52 (H) 15 - 41 U/L   ALT 47 (H) 0 - 44 U/L   Alkaline Phosphatase 183 (H) 38 - 126 U/L   Total Bilirubin 1.2 0.3 - 1.2 mg/dL   GFR calc non Af Amer >60 >60 mL/min   GFR calc Af Amer >60 >60 mL/min   Anion gap 11 5 - 15    Comment: Performed at [facility], [place] [address], [place], [place] [address]  LIPASE     Status: None   Collection Time: 09/17/19  2:52 PM  Result Value Ref Range   Lipase 36 11 - 51 U/L    Comment: Performed at [facility], [place] [address], [place], [place] [address]  POC SARS Coronavirus 2 Ag-ED - Nasal  Swab (BD Veritor Kit)     Status: Abnormal   Collection Time: 09/17/19 10:02 PM  Result Value Ref Range   SARS Coronavirus 2 Ag POSITIVE (A) NEGATIVE    Comment: (NOTE) SARS-CoV-2 antigen PRESENT. Positive results indicate the presence of viral antigens, but clinical correlation with patient history and other diagnostic information is necessary to determine patient infection status.  Positive results do not rule out bacterial infection or co-infection  with other viruses. False positive results are rare but can occur, and confirmatory RT-PCR testing may be appropriate in some circumstances. The expected result is Negative. Fact Sheet for Patients: PodPark.tn Fact Sheet for Providers: GiftContent.is  This test is not yet approved or cleared by the [place] FDA and  has been authorized for detection and/or diagnosis of SARS-CoV-2 by FDA under an Emergency Use Authorization (EUA).  This EUA will remain in effect (meaning this test can be used) for the duration of  the COVID-19 declaration under Section 564(b)(1) of the Act, 21 U.S.C. section 360bbb-3(b)(1), unless the a uthorization is terminated or revoked sooner.   ABO/Rh     Status: None   Collection Time: 09/17/19 10:54 PM  Result Value Ref Range   ABO/RH(D)      O POS Performed at [facility] [address], [place],  [address]   CBC with Differential/Platelet     Status: Abnormal   Collection Time: 09/18/19  5:55 AM  Result Value Ref Range   WBC 4.0 4.0 - 10.5 K/uL   RBC 3.93 3.87 - 5.11 MIL/uL   Hemoglobin 10.9 (L) 12.0 - 15.0 g/dL   HCT 33.1 (L) 36.0 - 46.0 %   MCV 84.2 80.0 - 100.0 fL   MCH 27.7 26.0 - 34.0 pg   MCHC 32.9 30.0 - 36.0 g/dL   RDW 12.6 11.5 - 15.5 %   Platelets 200 150 - 400 K/uL   nRBC 0.0 0.0 - 0.2 %   Neutrophils Relative % 56 %   Neutro Abs 2.2 1.7 - 7.7 K/uL   Lymphocytes Relative 38 %   Lymphs Abs 1.5 0.7 - 4.0 K/uL    Monocytes  Relative 4 %   Monocytes Absolute 0.2 0.1 - 1.0 K/uL   Eosinophils Relative 0 %   Eosinophils Absolute 0.0 0.0 - 0.5 K/uL   Basophils Relative 0 %   Basophils Absolute 0.0 0.0 - 0.1 K/uL   WBC Morphology MORPHOLOGY UNREMARKABLE    Immature Granulocytes 2 %   Abs Immature Granulocytes 0.09 (H) 0.00 - 0.07 K/uL    Comment: Performed at [facility] [address], [place], [place] [address]  Comprehensive metabolic panel     Status: Abnormal   Collection Time: 09/18/19  5:55 AM  Result Value Ref Range   Sodium 133 (L) 135 - 145 mmol/L   Potassium 3.8 3.5 - 5.1 mmol/L   Chloride 105 98 - 111 mmol/L   CO2 18 (L) 22 - 32 mmol/L   Glucose, Bld 120 (H) 70 - 99 mg/dL   BUN 6 6 - 20 mg/dL   Creatinine, Ser 0.79 0.44 - 1.00 mg/dL   Calcium 8.9 8.9 - 10.3 mg/dL   Total Protein 5.1 (L) 6.5 - 8.1 g/dL   Albumin 2.2 (L) 3.5 - 5.0 g/dL   AST 47 (H) 15 - 41 U/L   ALT 38 0 - 44 U/L   Alkaline Phosphatase 141 (H) 38 - 126 U/L   Total Bilirubin 1.6 (H) 0.3 - 1.2 mg/dL   GFR calc non Af Amer >60 >60 mL/min   GFR calc Af Amer >60 >60 mL/min   Anion gap 10 5 - 15    Comment: Performed at [facility], [place] [address], [place], [place] [address]  D-dimer, quantitative (not at [facility])     Status: Abnormal   Collection Time: 09/18/19  5:55 AM  Result Value Ref Range   D-Dimer, Quant 0.63 (H) 0.00 - 0.50 ug/mL-FEU    Comment: (NOTE) At the manufacturer cut-off of 0.50 ug/mL FEU, this assay has been documented to exclude PE with a sensitivity and negative predictive value of 97 to 99%.  At this time, this assay has not been approved by the FDA to exclude DVT/VTE. Results should be correlated with clinical presentation. Performed at [facility], [place] [address] [place], [place] [address]   C-reactive protein     Status: None   Collection Time: 09/18/19  5:55 AM  Result Value Ref Range   CRP 0.9 <1.0 mg/dL    Comment: Performed at [facility], [place] [address] [place], [place] [address]  Phosphorus     Status: None   Collection Time: 09/18/19  5:55 AM  Result Value Ref Range   Phosphorus 3.1 2.5 - 4.6 mg/dL    Comment: Performed at [facility] [address], [place], [place] [address]  Ferritin     Status: None   Collection Time: 09/18/19  5:55 AM  Result Value Ref Range   Ferritin 59 11 - 307 ng/mL    Comment: Performed at [facility] [address], [place],  [address]  Magnesium     Status: None   Collection Time: 09/18/19  5:55 AM  Result Value Ref Range   Magnesium 1.7 1.7 - 2.4 mg/dL    Comment: Performed at [facility] [address] [place],  [address]    Dg Chest Portable 1 View  Result Date: 09/17/2019 CLINICAL DATA:  Shortness of breath.  COVID-19 positive. EXAM: PORTABLE CHEST 1 VIEW COMPARISON:  September 11, 2019. FINDINGS: The heart size and mediastinal  contours are within normal limits. Both lungs are clear. No pneumothorax or pleural effusion is noted. The visualized skeletal structures are unremarkable. IMPRESSION: No active disease. Electronically Signed   By: Marijo Conception M.D.   On: 09/17/2019 20:14    Assessment/Plan: 43 yo G3P1103 @ 30.5 wga presenting with desaturations in setting of known COVID positive. Julia Ibarra clinically appears overall well, now reporting minimal symptoms. However, Julia Ibarra is requiring 2L [place].  -  As long as her oxygen is stable on treatment, recommend NSTs daily. If saturations persistently less than goal, would potentially require additional fetal monitoring.   --currently, recommend NST DAILY. OB rapid response to perform.   - The following treatments should not be withheld if Julia Ibarra qualifies for them:  - Dexamethasone              --instead of the traditional dosing for COVID patients, I recommend four doses of 6 mg IV 12 hours apart to induce fetal maturation and continue maternal treatment to complete the course of dexamethasone (6 mg orally or intravenously  daily for 10 days or until discharge, whichever is shorter  - Remdesivir We discussed the above treatments and that while we do not have extensive data in pregnancy, I feel the benefits outweigh the potential risks.   - I recommend against the use of ribavirin as it is a known teratogenic.   - Delivery at this point is not recommended unless significantly worsening clinical status  - Please alert OB if:  - Julia Ibarra begins to experience consistent contractions (5 minutes apart for 30 minutes straight), leaking of fluid, vaginal bleeding, or decreased fetal movement.  - Julia Ibarra persistently is below goal oxygen saturations  - significantly worsening clinical status.    Please contact me if any questions or concerns regarding her pregnancy. I am on call for [facility]  until 7:30am on 09/20/19.   Tyson Dense 09/18/2019

## 2019-09-18 NOTE — ED Notes (Signed)
Pt requesting to trial off of oxygen at this time. Pt oxygen has been 100% on 2L [place]. Pt removed [place] at this time. Will cont to monitor.

## 2019-09-18 NOTE — ED Notes (Signed)
MD at bedside. 

## 2019-09-18 NOTE — ED Notes (Signed)
Pt ambulatory in room. Oxygen saturations remained 97% and above. HR went up to 106. Pt denied any SOB with exertion.

## 2019-09-18 NOTE — ED Notes (Signed)
Pt oxygen maintaining 97% and above on room air.

## 2019-09-18 NOTE — ED Notes (Signed)
Meal tray order

## 2019-09-18 NOTE — ED Notes (Addendum)
Pt offered restroom assistance. States unable to void at this time. Pt endorses some nausea. Meal tray at bedside. Fluid bag changed of NS. Respiratory status shows no acute distress with regular non labored breaths with 2L O2 via nasal cannula.

## 2019-09-18 NOTE — Consult Note (Signed)
I was called to give recommendations for this 43 yo pregnancy patient who is COVID positive.Further recs are pending.   In this acute setting, I suggest:   - As long as her oxygen is stable on treatment, recommend NSTs daily. Given her O2 requirement, I recommend BID NST The following treatments should not be withheld if she qualifies for them: - Dexamethasone  --instead of the traditional dosing for COVID patients, I suggest four doses of 6 mg IV 12 hours apart to induce fetal maturation and continue maternal treatment to complete the course of dexamethasone (6 mg orally or intravenously daily for 10 days or until discharge, whichever is shorter - Remdesivir   Will see and evaluate the patient and give further recommendations.   Arty Baumgartner MD

## 2019-09-18 NOTE — Progress Notes (Addendum)
Pt is a G3P3 at 31 2/[redacted] weeks gestation here because she is covid pos. Pt says she came in because of N&V but then became short of breath and her 02 sats were dropping.She is presently stable on oxygen. She is a previous C/S x2. She has a set of twins.She is planning a C/S with this baby on Jan 28th. Dr. Royston Sinner requested an NST. Pt is currently waiting on a bed. Pt denies vaginal bleeding or leaking of fluid.

## 2019-09-19 LAB — COMPREHENSIVE METABOLIC PANEL
ALT: 38 U/L (ref 0–44)
AST: 38 U/L (ref 15–41)
Albumin: 2.2 g/dL — ABNORMAL LOW (ref 3.5–5.0)
Alkaline Phosphatase: 152 U/L — ABNORMAL HIGH (ref 38–126)
Anion gap: 11 (ref 5–15)
BUN: <5 mg/dL — ABNORMAL LOW (ref 6–20)
CO2: 19 mmol/L — ABNORMAL LOW (ref 22–32)
Calcium: 9.2 mg/dL (ref 8.9–10.3)
Chloride: 107 mmol/L (ref 98–111)
Creatinine, Ser: 0.71 mg/dL (ref 0.44–1.00)
GFR calc Af Amer: >60 mL/min (ref >60)
GFR calc non Af Amer: >60 mL/min (ref >60)
Glucose, Bld: 123 mg/dL — ABNORMAL HIGH (ref 70–99)
Potassium: 3.6 mmol/L (ref 3.5–5.1)
Sodium: 137 mmol/L (ref 135–145)
Total Bilirubin: 1.4 mg/dL — ABNORMAL HIGH (ref 0.3–1.2)
Total Protein: 5.4 g/dL — ABNORMAL LOW (ref 6.5–8.1)

## 2019-09-19 LAB — CBC WITH DIFFERENTIAL/PLATELET
Abs Immature Granulocytes: 0.24 10*3/uL — ABNORMAL HIGH (ref 0.00–0.07)
Basophils Absolute: 0 10*3/uL (ref 0.0–0.1)
Basophils Relative: 0 %
Eosinophils Absolute: 0 10*3/uL (ref 0.0–0.5)
Eosinophils Relative: 0 %
HCT: 32.3 % — ABNORMAL LOW (ref 36.0–46.0)
Hemoglobin: 10.7 g/dL — ABNORMAL LOW (ref 12.0–15.0)
Immature Granulocytes: 4 %
Lymphocytes Relative: 28 %
Lymphs Abs: 1.6 10*3/uL (ref 0.7–4.0)
MCH: 27.7 pg (ref 26.0–34.0)
MCHC: 33.1 g/dL (ref 30.0–36.0)
MCV: 83.7 fL (ref 80.0–100.0)
Monocytes Absolute: 0.3 10*3/uL (ref 0.1–1.0)
Monocytes Relative: 5 %
Neutro Abs: 3.7 10*3/uL (ref 1.7–7.7)
Neutrophils Relative %: 63 %
Platelets: 216 10*3/uL (ref 150–400)
RBC: 3.86 MIL/uL — ABNORMAL LOW (ref 3.87–5.11)
RDW: 13 % (ref 11.5–15.5)
WBC: 5.8 10*3/uL (ref 4.0–10.5)
nRBC: 0 % (ref 0.0–0.2)

## 2019-09-19 LAB — FERRITIN: Ferritin: 68 ng/mL (ref 11–307)

## 2019-09-19 LAB — D-DIMER, QUANTITATIVE: D-Dimer, Quant: 0.49 ug/mL-FEU (ref 0.00–0.50)

## 2019-09-19 LAB — C-REACTIVE PROTEIN: CRP: 0.9 mg/dL (ref <1.0)

## 2019-09-19 LAB — PHOSPHORUS: Phosphorus: 2.7 mg/dL (ref 2.5–4.6)

## 2019-09-19 LAB — HIV ANTIBODY (ROUTINE TESTING W REFLEX): HIV Screen 4th Generation wRfx: NONREACTIVE

## 2019-09-19 LAB — MAGNESIUM: Magnesium: 1.8 mg/dL (ref 1.7–2.4)

## 2019-09-19 NOTE — Progress Notes (Signed)
PROGRESS NOTE    Julia Ibarra  [id number] DOB: [birth year] DOA: 09/17/2019 PCP: Flossie Buffy, NP   Brief Narrative: 43 year old female with the no past medical history, 31-week pregnant comes to the emergency department with complaints of nausea, vomiting, cough, shortness of breath.  Patient was exposed to a coworker who tested positive for COVID-19.  Has been experiencing generalized body aches, malaise.  She continues to have vomiting, unable to keep anything down.  This is also associated with the shortness of breath with any exertion.  Work-up in the emergency department patient was having oxygen saturations in the mid 80s on room air, tachycardic, tachypneic.  Chest x-ray is negative for acute cardiopulmonary disease.  BMP significant for mild hyponatremia and slight elevation of the transaminases.  Patient was given 2 L of [place] lactate, Reglan, and Zofran in the emergency department.  COVID-19 came back to be positive.  OB/GYN is consulted, recommended to continue with the dexamethasone 6 mg 2 times a day, continue with remdesivir.  Assessment & Plan:   Principal Problem:   Acute respiratory failure with hypoxia (HCC) Active Problems:   Hypovolemia associated with vomiting   Pregnancy   COVID-19 virus infection   ##Nausea and vomiting -Continue with antinausea medications -Continue with IV fluids -Continue with regular diet, tolerating well  ##Acute hypoxic respiratory failure -Secondary to COVID-19 pneumonia -Continue with the oxygen through nasal cannula to keep O2 sats greater than 92%. -Currently on room air  ##COVID-19 pneumonia -Continue with the dexamethasone 6 mg 2 times a day, remdesivir -Continue with isolation precautions -Closely follow-up with inflammatory markers  ##Pregnancy -Appreciate OB follow-up -Recommended to continue with the NST 2 times a day   DVT prophylaxis: Heparin Code Status: Full code  family Communication: Patient   disposition Plan: Pending clinical improvement  Consultants:  OB/GYN  Procedures: NST  Antimicrobials: Remdesivir 09/18/2019-  Subjective: Patient states a significant improvement from yesterday.  Denies any shortness of breath.  Has mild cough.  Tolerating diet well.  On room air  Objective: Vitals:   09/18/19 2015 09/18/19 2337 09/19/19 0755 09/19/19 1615  BP: 125/75 125/85 128/70 128/74  Pulse: 85 92 95 99  Resp: 18 18 19 19   Temp: 98 F (36.7 C) 98.3 F (36.8 C) 98 F (36.7 C) 98.8 F (37.1 C)  TempSrc: Oral Oral Oral Oral  SpO2: 97% 97% 100% 99%  Weight: 114.8 kg     Height: 5\' 6"  (1.676 m)       Intake/Output Summary (Last 24 hours) at 09/19/2019 1643 Last data filed at 09/19/2019 0400 Gross per 24 hour  Intake 400 ml  Output -  Net 400 ml   Filed Weights   09/17/19 1438 09/18/19 2015  Weight: 113 kg 114.8 kg    Examination:  General exam: Appears calm and comfortable  Respiratory system: Clear to auscultation. Respiratory effort normal. Cardiovascular system: S1 & S2 heard, RRR. No JVD, murmurs, rubs, gallops or clicks. No pedal edema. Gastrointestinal system: Abdomen is nondistended, soft and nontender. No organomegaly or masses felt. Normal bowel sounds heard. Central nervous system: Alert and oriented. No focal neurological deficits. Extremities: Symmetric 5 x 5 power. Skin: No rashes, lesions or ulcers Psychiatry: Judgement and insight appear normal. Mood & affect appropriate.     Data Reviewed: I have personally reviewed following labs and imaging studies  CBC: Recent Labs  Lab 09/17/19 1452 09/18/19 0555 09/19/19 0618  WBC 4.5 4.0 5.8  NEUTROABS 2.2 2.2 3.7  HGB 13.1 10.9*  10.7*  HCT 40.4 33.1* 32.3*  MCV 84.2 84.2 83.7  PLT 231 200 [id number]   Basic Metabolic Panel: Recent Labs  Lab 09/17/19 1452 09/18/19 0555 09/19/19 0618  NA 132* 133* 137  K 3.5 3.8 3.6  CL 102 105 107  CO2 19* 18* 19*  GLUCOSE 107* 120* 123*  BUN 7 6 <5*   CREATININE 0.83 0.79 0.71  CALCIUM 9.4 8.9 9.2  MG  --  1.7 1.8  PHOS  --  3.1 2.7   GFR: Estimated Creatinine Clearance: 119.1 mL/min (by C-G formula based on SCr of 0.71 mg/dL). Liver Function Tests: Recent Labs  Lab 09/17/19 1452 09/18/19 0555 09/19/19 0618  AST 52* 47* 38  ALT 47* 38 38  ALKPHOS 183* 141* 152*  BILITOT 1.2 1.6* 1.4*  PROT 6.7 5.1* 5.4*  ALBUMIN 2.8* 2.2* 2.2*   Recent Labs  Lab 09/17/19 1452  LIPASE 36   No results for input(s): AMMONIA in the last 168 hours. Coagulation Profile: No results for input(s): INR, PROTIME in the last 168 hours. Cardiac Enzymes: No results for input(s): CKTOTAL, CKMB, CKMBINDEX, TROPONINI in the last 168 hours. BNP (last 3 results) No results for input(s): PROBNP in the last 8760 hours. HbA1C: No results for input(s): HGBA1C in the last 72 hours. CBG: No results for input(s): GLUCAP in the last 168 hours. Lipid Profile: No results for input(s): CHOL, HDL, LDLCALC, TRIG, CHOLHDL, LDLDIRECT in the last 72 hours. Thyroid Function Tests: No results for input(s): TSH, T4TOTAL, FREET4, T3FREE, THYROIDAB in the last 72 hours. Anemia Panel: Recent Labs    09/18/19 0555 09/19/19 0618  FERRITIN 59 68   Sepsis Labs: No results for input(s): PROCALCITON, LATICACIDVEN in the last 168 hours.  No results found for this or any previous visit (from the past 240 hour(s)).       Radiology Studies: Dg Chest Portable 1 View  Result Date: 09/17/2019 CLINICAL DATA:  Shortness of breath.  COVID-19 positive. EXAM: PORTABLE CHEST 1 VIEW COMPARISON:  September 11, 2019. FINDINGS: The heart size and mediastinal contours are within normal limits. Both lungs are clear. No pneumothorax or pleural effusion is noted. The visualized skeletal structures are unremarkable. IMPRESSION: No active disease. Electronically Signed   By: Marijo Conception M.D.   On: 09/17/2019 20:14        Scheduled Meds: . heparin  5,000 Units Subcutaneous Q8H   . prenatal multivitamin  1 tablet Oral Q1200  . sodium chloride flush  3 mL Intravenous Q12H   Continuous Infusions: . sodium chloride Stopped (09/17/19 2135)  . magnesium sulfate bolus IVPB       LOS: 2 days    Time spent: 35 minutes   Quinton Voth, MD [facility] Pager [phone]  If 7PM-7AM, please contact night-coverage [email or web address] Password [facility] 09/19/2019, 4:43 PM

## 2019-09-19 NOTE — Progress Notes (Signed)
NST completed. Baseline 130 BPM, min-mod variability, 10x10 accels, no decels. Denies vaginal bleeding, abd cramping. Reports pos fetal movement.

## 2019-09-20 LAB — CBC WITH DIFFERENTIAL/PLATELET
Abs Immature Granulocytes: 0.62 10*3/uL — ABNORMAL HIGH (ref 0.00–0.07)
Basophils Absolute: 0 10*3/uL (ref 0.0–0.1)
Basophils Relative: 1 %
Eosinophils Absolute: 0 10*3/uL (ref 0.0–0.5)
Eosinophils Relative: 0 %
HCT: 31.2 % — ABNORMAL LOW (ref 36.0–46.0)
Hemoglobin: 10.3 g/dL — ABNORMAL LOW (ref 12.0–15.0)
Immature Granulocytes: 8 %
Lymphocytes Relative: 28 %
Lymphs Abs: 2.1 10*3/uL (ref 0.7–4.0)
MCH: 27.5 pg (ref 26.0–34.0)
MCHC: 33 g/dL (ref 30.0–36.0)
MCV: 83.4 fL (ref 80.0–100.0)
Monocytes Absolute: 0.4 10*3/uL (ref 0.1–1.0)
Monocytes Relative: 6 %
Neutro Abs: 4.3 10*3/uL (ref 1.7–7.7)
Neutrophils Relative %: 57 %
Platelets: 199 10*3/uL (ref 150–400)
RBC: 3.74 MIL/uL — ABNORMAL LOW (ref 3.87–5.11)
RDW: 12.8 % (ref 11.5–15.5)
WBC: 7.5 10*3/uL (ref 4.0–10.5)
nRBC: 0.3 % — ABNORMAL HIGH (ref 0.0–0.2)

## 2019-09-20 LAB — FERRITIN: Ferritin: 69 ng/mL (ref 11–307)

## 2019-09-20 LAB — COMPREHENSIVE METABOLIC PANEL
ALT: 64 U/L — ABNORMAL HIGH (ref 0–44)
AST: 79 U/L — ABNORMAL HIGH (ref 15–41)
Albumin: 2.1 g/dL — ABNORMAL LOW (ref 3.5–5.0)
Alkaline Phosphatase: 159 U/L — ABNORMAL HIGH (ref 38–126)
Anion gap: 8 (ref 5–15)
BUN: 6 mg/dL (ref 6–20)
CO2: 21 mmol/L — ABNORMAL LOW (ref 22–32)
Calcium: 8.6 mg/dL — ABNORMAL LOW (ref 8.9–10.3)
Chloride: 104 mmol/L (ref 98–111)
Creatinine, Ser: 0.78 mg/dL (ref 0.44–1.00)
GFR calc Af Amer: >60 mL/min (ref >60)
GFR calc non Af Amer: >60 mL/min (ref >60)
Glucose, Bld: 99 mg/dL (ref 70–99)
Potassium: 2.9 mmol/L — ABNORMAL LOW (ref 3.5–5.1)
Sodium: 133 mmol/L — ABNORMAL LOW (ref 135–145)
Total Bilirubin: 0.7 mg/dL (ref 0.3–1.2)
Total Protein: 5.2 g/dL — ABNORMAL LOW (ref 6.5–8.1)

## 2019-09-20 LAB — MAGNESIUM: Magnesium: 1.5 mg/dL — ABNORMAL LOW (ref 1.7–2.4)

## 2019-09-20 LAB — C-REACTIVE PROTEIN: CRP: 1.1 mg/dL — ABNORMAL HIGH (ref <1.0)

## 2019-09-20 LAB — D-DIMER, QUANTITATIVE: D-Dimer, Quant: 0.98 ug/mL-FEU — ABNORMAL HIGH (ref 0.00–0.50)

## 2019-09-20 MED ORDER — SODIUM CHLORIDE 0.9 % IV SOLN
100.0000 mg | INTRAVENOUS | Status: DC
  Administered 2019-09-21 – 2019-09-23 (×3): 100 mg via INTRAVENOUS
  Filled 2019-09-20 (×4): qty 20

## 2019-09-20 MED ORDER — POTASSIUM PHOSPHATE MONOBASIC 500 MG PO TABS
500.0000 mg | ORAL_TABLET | Freq: Three times a day (TID) | ORAL | Status: DC
  Filled 2019-09-20 (×5): qty 1

## 2019-09-20 MED ORDER — MAGNESIUM SULFATE 4 GM/100ML IV SOLN
4.0000 g | Freq: Once | INTRAVENOUS | Status: AC
  Administered 2019-09-20: 4 g via INTRAVENOUS
  Filled 2019-09-20: qty 100

## 2019-09-20 MED ORDER — K PHOS MONO-SOD PHOS DI & MONO 155-852-130 MG PO TABS
500.0000 mg | ORAL_TABLET | Freq: Three times a day (TID) | ORAL | Status: DC
  Filled 2019-09-20: qty 2

## 2019-09-20 MED ORDER — K PHOS MONO-SOD PHOS DI & MONO 155-852-130 MG PO TABS
500.0000 mg | ORAL_TABLET | Freq: Three times a day (TID) | ORAL | Status: DC
  Administered 2019-09-20 – 2019-09-22 (×7): 500 mg via ORAL
  Filled 2019-09-20 (×10): qty 2

## 2019-09-20 MED ORDER — BENZONATATE 100 MG PO CAPS
100.0000 mg | ORAL_CAPSULE | ORAL | Status: DC | PRN
  Administered 2019-09-20 – 2019-09-23 (×6): 100 mg via ORAL
  Filled 2019-09-20 (×6): qty 1

## 2019-09-20 MED ORDER — POTASSIUM CHLORIDE 20 MEQ PO PACK
40.0000 meq | PACK | ORAL | Status: AC
  Administered 2019-09-20: 40 meq via ORAL
  Filled 2019-09-20 (×3): qty 2

## 2019-09-20 MED ORDER — SODIUM CHLORIDE 0.9 % IV SOLN
200.0000 mg | Freq: Once | INTRAVENOUS | Status: AC
  Administered 2019-09-20: 200 mg via INTRAVENOUS
  Filled 2019-09-20: qty 40

## 2019-09-20 NOTE — Progress Notes (Addendum)
G3P2 at 3 4/7 in hospital for s/s COVID-19.  Currently, ST/temp 100.9/sats on room air 96-98%.  C/o HA.  Sees Dr Uvaldo Bristle for [facility].  Nect appt is 12/4 with her office.  No c/o abdominal cramps/cxns/leaking/bleeding.

## 2019-09-20 NOTE — Progress Notes (Signed)
NST reassuring/reative for 31 4/[redacted] weeks gestation.

## 2019-09-20 NOTE — Progress Notes (Signed)
   Vital Signs MEWS/VS Documentation      09/20/2019 0915 09/20/2019 0916 09/20/2019 0946 09/20/2019 0958   MEWS Score:  3  3  2  1    MEWS Score Color:  Yellow  Yellow  Yellow  Green   Resp:  (!) 24  (!) 21  20  18    Pulse:  -  -  -  (!) 102   BP:  -  -  -  110/61   Temp:  -  -  -  99.1 F (37.3 C)   O2 Device:  -  -  -  Room Air   Level of Consciousness:  -  Alert  -  Alert       Patient continues to run Sinus Tach on telemetry monitoring. 98% on Room Air with no c/o SOB. Temperature 100.9, only c/o a headache at this time. Tylenol administered, VS rechecked. Will continue to monitor.    Sarra Rachels 09/20/2019,10:19 AM

## 2019-09-20 NOTE — Progress Notes (Signed)
PROGRESS NOTE    Julia Ibarra  [id number] DOB: 11-Nov-1975 DOA: 09/17/2019 PCP: Flossie Buffy, NP   Brief Narrative: 43 year old female with the no past medical history, 31-week pregnant comes to the emergency department with complaints of nausea, vomiting, cough, shortness of breath.  Patient was exposed to a coworker who tested positive for COVID-19.  Has been experiencing generalized body aches, malaise.  She continues to have vomiting, unable to keep anything down.  This is also associated with the shortness of breath with any exertion.  Work-up in the emergency department patient was having oxygen saturations in the mid 80s on room air, tachycardic, tachypneic.  Chest x-ray is negative for acute cardiopulmonary disease.  BMP significant for mild hyponatremia and slight elevation of the transaminases.  Patient was given 2 L of Ringer lactate, Reglan, and Zofran in the emergency department.  COVID-19 came back to be positive.  OB/GYN is consulted, recommended to continue with the dexamethasone 6 mg 2 times a day, continue with remdesivir.  Assessment & Plan:   Principal Problem:   Acute respiratory failure with hypoxia (HCC) Active Problems:   Hypovolemia associated with vomiting   Pregnancy   COVID-19 virus infection   ##Nausea and vomiting -Continue with antinausea medications -Continue with IV fluids -Continue with regular diet, tolerating well  ##Acute hypoxic respiratory failure -Secondary to COVID-19 pneumonia -Continue with the oxygen through nasal cannula to keep O2 sats greater than 92%. -Currently on room air  ##COVID-19 pneumonia -Continue with the dexamethasone 6 mg 2 times a day,  -Patient is started on remdesivir 09/20/2019. -Continue with isolation precautions -Closely follow-up with inflammatory markers  ##Hypokalemia -Replace by mouth  ##Hypophosphatemia -Replace with K-Phos  ##Hypomagnesemia -Replace by IV  ##Elevated LFTs -From COVID-19  infection -Follow-up the trend  ##Pregnancy -Appreciate OB follow-up -Recommended to continue with the NST 2 times a day   DVT prophylaxis: Heparin Code Status: Full code  family Communication: Patient  disposition Plan: Pending clinical improvement  Consultants:  OB/GYN  Procedures: NST  Antimicrobials: Remdesivir 09/18/2019-  Subjective: Patient spiked a low-grade fever of 100.2.  Complaining of cough at nighttime.  Patient is still on room air.  Denies any shortness of breath. Objective: Vitals:   09/20/19 1000 09/20/19 1100 09/20/19 1200 09/20/19 1243  BP:      Pulse:      Resp: 20 20 19  (!) 21  Temp:      TempSrc:      SpO2:      Weight:      Height:       No intake or output data in the 24 hours ending 09/20/19 1432 Filed Weights   09/17/19 1438 09/18/19 2015  Weight: 113 kg 114.8 kg    Examination:  General exam: Appears calm and comfortable  Respiratory system: Clear to auscultation. Respiratory effort normal. Cardiovascular system: S1 & S2 heard, RRR. No JVD, murmurs, rubs, gallops or clicks. No pedal edema. Gastrointestinal system: Abdomen is nondistended, soft and nontender. No organomegaly or masses felt. Normal bowel sounds heard. Central nervous system: Alert and oriented. No focal neurological deficits. Extremities: Symmetric 5 x 5 power. Skin: No rashes, lesions or ulcers Psychiatry: Judgement and insight appear normal. Mood & affect appropriate.     Data Reviewed: I have personally reviewed following labs and imaging studies  CBC: Recent Labs  Lab 09/17/19 1452 09/18/19 0555 09/19/19 0618 09/20/19 0553  WBC 4.5 4.0 5.8 7.5  NEUTROABS 2.2 2.2 3.7 4.3  HGB 13.1 10.9* 10.7*  10.3*  HCT 40.4 33.1* 32.3* 31.2*  MCV 84.2 84.2 83.7 83.4  PLT 231 200 216 [id number]   Basic Metabolic Panel: Recent Labs  Lab 09/17/19 1452 09/18/19 0555 09/19/19 0618 09/20/19 0553  NA 132* 133* 137 133*  K 3.5 3.8 3.6 2.9*  CL 102 105 107 104  CO2 19* 18*  19* 21*  GLUCOSE 107* 120* 123* 99  BUN 7 6 <5* 6  CREATININE 0.83 0.79 0.71 0.78  CALCIUM 9.4 8.9 9.2 8.6*  MG  --  1.7 1.8 1.5*  PHOS  --  3.1 2.7 <1.0*   GFR: Estimated Creatinine Clearance: 119.1 mL/min (by C-G formula based on SCr of 0.78 mg/dL). Liver Function Tests: Recent Labs  Lab 09/17/19 1452 09/18/19 0555 09/19/19 0618 09/20/19 0553  AST 52* 47* 38 79*  ALT 47* 38 38 64*  ALKPHOS 183* 141* 152* 159*  BILITOT 1.2 1.6* 1.4* 0.7  PROT 6.7 5.1* 5.4* 5.2*  ALBUMIN 2.8* 2.2* 2.2* 2.1*   Recent Labs  Lab 09/17/19 1452  LIPASE 36   No results for input(s): AMMONIA in the last 168 hours. Coagulation Profile: No results for input(s): INR, PROTIME in the last 168 hours. Cardiac Enzymes: No results for input(s): CKTOTAL, CKMB, CKMBINDEX, TROPONINI in the last 168 hours. BNP (last 3 results) No results for input(s): PROBNP in the last 8760 hours. HbA1C: No results for input(s): HGBA1C in the last 72 hours. CBG: No results for input(s): GLUCAP in the last 168 hours. Lipid Profile: No results for input(s): CHOL, HDL, LDLCALC, TRIG, CHOLHDL, LDLDIRECT in the last 72 hours. Thyroid Function Tests: No results for input(s): TSH, T4TOTAL, FREET4, T3FREE, THYROIDAB in the last 72 hours. Anemia Panel: Recent Labs    09/19/19 0618 09/20/19 0553  FERRITIN 68 69   Sepsis Labs: No results for input(s): PROCALCITON, LATICACIDVEN in the last 168 hours.  No results found for this or any previous visit (from the past 240 hour(s)).       Radiology Studies: No results found.      Scheduled Meds: . heparin  5,000 Units Subcutaneous Q8H  . potassium chloride  40 mEq Oral Q4H  . potassium phosphate (monobasic)  500 mg Oral TID WC & HS  . prenatal multivitamin  1 tablet Oral Q1200  . sodium chloride flush  3 mL Intravenous Q12H   Continuous Infusions: . sodium chloride Stopped (09/17/19 2135)  . remdesivir 200 mg in NS 250 mL     Followed by  . [START ON  09/21/2019] remdesivir 100 mg in NS 250 mL       LOS: 3 days    Time spent: 21 minutes   Cecila Satcher, MD [facility] Pager [phone]  If 7PM-7AM, please contact night-coverage [email or web address] Password [email or web address] 09/20/2019, 2:32 PM

## 2019-09-21 DIAGNOSIS — O99513 Diseases of the respiratory system complicating pregnancy, third trimester: Secondary | ICD-10-CM | POA: Diagnosis not present

## 2019-09-21 DIAGNOSIS — U071 COVID-19: Secondary | ICD-10-CM | POA: Diagnosis not present

## 2019-09-21 DIAGNOSIS — E871 Hypo-osmolality and hyponatremia: Secondary | ICD-10-CM | POA: Diagnosis not present

## 2019-09-21 DIAGNOSIS — J9601 Acute respiratory failure with hypoxia: Secondary | ICD-10-CM | POA: Diagnosis not present

## 2019-09-21 DIAGNOSIS — E861 Hypovolemia: Secondary | ICD-10-CM | POA: Diagnosis not present

## 2019-09-21 DIAGNOSIS — J1289 Other viral pneumonia: Secondary | ICD-10-CM | POA: Diagnosis not present

## 2019-09-21 DIAGNOSIS — O98513 Other viral diseases complicating pregnancy, third trimester: Secondary | ICD-10-CM | POA: Diagnosis not present

## 2019-09-21 DIAGNOSIS — O99013 Anemia complicating pregnancy, third trimester: Secondary | ICD-10-CM | POA: Diagnosis not present

## 2019-09-21 LAB — COMPREHENSIVE METABOLIC PANEL
ALT: 113 U/L — ABNORMAL HIGH (ref 0–44)
AST: 133 U/L — ABNORMAL HIGH (ref 15–41)
Albumin: 2.2 g/dL — ABNORMAL LOW (ref 3.5–5.0)
Alkaline Phosphatase: 168 U/L — ABNORMAL HIGH (ref 38–126)
Anion gap: 10 (ref 5–15)
BUN: <5 mg/dL — ABNORMAL LOW (ref 6–20)
CO2: 21 mmol/L — ABNORMAL LOW (ref 22–32)
Calcium: 8.4 mg/dL — ABNORMAL LOW (ref 8.9–10.3)
Chloride: 104 mmol/L (ref 98–111)
Creatinine, Ser: 0.7 mg/dL (ref 0.44–1.00)
GFR calc Af Amer: >60 mL/min (ref >60)
GFR calc non Af Amer: >60 mL/min (ref >60)
Glucose, Bld: 103 mg/dL — ABNORMAL HIGH (ref 70–99)
Potassium: 2.9 mmol/L — ABNORMAL LOW (ref 3.5–5.1)
Sodium: 135 mmol/L (ref 135–145)
Total Bilirubin: 0.9 mg/dL (ref 0.3–1.2)
Total Protein: 5.1 g/dL — ABNORMAL LOW (ref 6.5–8.1)

## 2019-09-21 LAB — CBC WITH DIFFERENTIAL/PLATELET
Abs Immature Granulocytes: 0.51 10*3/uL — ABNORMAL HIGH (ref 0.00–0.07)
Basophils Absolute: 0 10*3/uL (ref 0.0–0.1)
Basophils Relative: 1 %
Eosinophils Absolute: 0 10*3/uL (ref 0.0–0.5)
Eosinophils Relative: 0 %
HCT: 31.4 % — ABNORMAL LOW (ref 36.0–46.0)
Hemoglobin: 10.7 g/dL — ABNORMAL LOW (ref 12.0–15.0)
Immature Granulocytes: 9 %
Lymphocytes Relative: 28 %
Lymphs Abs: 1.7 10*3/uL (ref 0.7–4.0)
MCH: 27.9 pg (ref 26.0–34.0)
MCHC: 34.1 g/dL (ref 30.0–36.0)
MCV: 81.8 fL (ref 80.0–100.0)
Monocytes Absolute: 0.4 10*3/uL (ref 0.1–1.0)
Monocytes Relative: 7 %
Neutro Abs: 3.3 10*3/uL (ref 1.7–7.7)
Neutrophils Relative %: 55 %
Platelets: 211 10*3/uL (ref 150–400)
RBC: 3.84 MIL/uL — ABNORMAL LOW (ref 3.87–5.11)
RDW: 12.9 % (ref 11.5–15.5)
WBC: 5.9 10*3/uL (ref 4.0–10.5)
nRBC: 0.3 % — ABNORMAL HIGH (ref 0.0–0.2)

## 2019-09-21 LAB — PHOSPHORUS
Phosphorus: <1 mg/dL — CL (ref 2.5–4.6)
Phosphorus: 2.6 mg/dL (ref 2.5–4.6)

## 2019-09-21 LAB — D-DIMER, QUANTITATIVE: D-Dimer, Quant: 1.11 ug/mL-FEU — ABNORMAL HIGH (ref 0.00–0.50)

## 2019-09-21 LAB — MAGNESIUM: Magnesium: 2 mg/dL (ref 1.7–2.4)

## 2019-09-21 LAB — C-REACTIVE PROTEIN: CRP: 4.2 mg/dL — ABNORMAL HIGH (ref <1.0)

## 2019-09-21 LAB — FERRITIN: Ferritin: 96 ng/mL (ref 11–307)

## 2019-09-21 MED ORDER — DEXAMETHASONE SODIUM PHOSPHATE 10 MG/ML IJ SOLN
6.0000 mg | Freq: Every day | INTRAMUSCULAR | Status: DC
  Administered 2019-09-22 – 2019-09-24 (×3): 6 mg via INTRAVENOUS
  Filled 2019-09-21 (×3): qty 1

## 2019-09-21 MED ORDER — POTASSIUM CHLORIDE CRYS ER 20 MEQ PO TBCR
40.0000 meq | EXTENDED_RELEASE_TABLET | Freq: Three times a day (TID) | ORAL | Status: AC
  Administered 2019-09-21 – 2019-09-22 (×3): 40 meq via ORAL
  Filled 2019-09-21 (×3): qty 2

## 2019-09-21 NOTE — Progress Notes (Addendum)
1530 Here to perform daily NST for this 43 yo G3P2 @ 31.[redacted] wks GA in with Covid-19. Patient says not feeling well today. C/O increased nasal stuffiness. Denies SOB at this time. Denies vaginal bleeding or LOF or UC's and reports good fetal movement. 1535 Dr. Gaetano Net updated on patient, EFM, and lab values.

## 2019-09-21 NOTE — Progress Notes (Signed)
   Vital Signs MEWS/VS Documentation      09/20/2019 2300 09/20/2019 2313 09/21/2019 0005 09/21/2019 0058   MEWS Score:  5  4  4  2    MEWS Score Color:  Red  Red  Red  Yellow   Resp:  -  -  (!) 22  (!) 22   Pulse:  -  (!) 115  (!) 118  -   BP:  -  134/80  125/78  117/70   Temp:  -  (!) 101.3 F (38.5 C)  (!) 100.6 F (38.1 C)  99.9 F (37.7 C)   O2 Device:  -  -  Room Air  Room Air   Level of Consciousness:  Alert  -  -  -     Patients MEWS was noted to be red. Call to [facility], and charge nurse, and MD notified as per policy. Patient received tylenol. NAD noted. Remained alert and oriented with respirations even and unmlabored and skin warm and dry.       Leidi Astle R Suttyn Cryder 09/21/2019,3:48 AM

## 2019-09-21 NOTE — Progress Notes (Signed)
PROGRESS NOTE    Julia Ibarra  [id number] DOB: 09-Oct-1976 DOA: 09/17/2019 PCP: Julia Buffy, NP      Brief Narrative:  Mrs. Julia Ibarra is a 43 y.o. G3P2 F at [redacted]w[redacted]d with no significant past medical history presents with cough, shortness of breath, nausea, generalized body aches nausea, and recent exposure to COVID-19.  In the ER, the patient desaturated to the mid 80s on room air, chest x-ray showed no focal airspace disease or opacity.  Transaminases slightly elevated.  SARS-CoV-2 PCR positive.    Assessment & Plan:  Coronavirus pneumonitis with acute hypoxic respiratory failure Had a fairly impressive O2 requirment at admission, despite CXR clear and now remained off oxygen for the last 48 hours. -Continue remdesivir, day 2 of 5 -Continue Decadron, day 4    Supervision of normal pregnancy -Consult OB, appreciate recommendations  Hypokalemia Mag replete -Supplement K  Anemia Hemoglobin normal relative to baseline during pregnancy      MDM and disposition: The below labs and imaging reports were reviewed and summarized above.  Medication management as above.  The patient was admitted with COVID-19.          DVT prophylaxis: Heparin Code Status: Full code Family Communication:     Consultants:   Obstetrics  Procedures:     Antimicrobials:   Remdesivir   Subjective: Patient has had cold symptoms, coryza, sinus congestion, severe cough.  But no dyspnea, hemoptysis, confusion, trouble breathing.  Objective: Vitals:   09/21/19 0058 09/21/19 0533 09/21/19 0800 09/21/19 0900  BP: 117/70 119/81 104/68   Pulse:  100 (!) 104   Resp: (!) 22  19 18   Temp: 99.9 F (37.7 C) 99.8 F (37.7 C) 97.7 F (36.5 C)   TempSrc: Oral Oral Oral   SpO2: 94% 95% 97%   Weight:      Height:        Intake/Output Summary (Last 24 hours) at 09/21/2019 1633 Last data filed at 09/21/2019 0500 Gross per 24 hour  Intake 580.05 ml  Output -  Net 580.05  ml   Filed Weights   09/17/19 1438 09/18/19 2015  Weight: 113 kg 114.8 kg    Examination: General appearance:  adult female, alert and in no acute distress.  Appears tired. HEENT: Anicteric, conjunctiva pink, lids and lashes normal. No nasal deformity, discharge, epistaxis.  Lips moist.   Skin: Warm and dry.  No suspicious rashes or lesions. Cardiac: RRR, nl S1-S2, no murmurs appreciated.  Capillary refill is brisk.  JVP not visible.  No LE edema.  Radial pulses 2+ and symmetric. Respiratory: Normal respiratory rate and rhythm.  CTAB without rales or wheezes. Abdomen: Abdomen soft.   No TTP or guarding. No ascites, distension, hepatosplenomegaly.   MSK: No deformities or effusions. Neuro: Awake and alert.  EOMI, moves all extremities. Speech fluent.    Psych: Sensorium intact and responding to questions, attention normal. Affect normal.  Judgment and insight appear normal.    Data Reviewed: I have personally reviewed following labs and imaging studies:  CBC: Recent Labs  Lab 09/17/19 1452 09/18/19 0555 09/19/19 0618 09/20/19 0553 09/21/19 0126  WBC 4.5 4.0 5.8 7.5 5.9  NEUTROABS 2.2 2.2 3.7 4.3 3.3  HGB 13.1 10.9* 10.7* 10.3* 10.7*  HCT 40.4 33.1* 32.3* 31.2* 31.4*  MCV 84.2 84.2 83.7 83.4 81.8  PLT 231 200 216 199 [id number]   Basic Metabolic Panel: Recent Labs  Lab 09/17/19 1452 09/18/19 0555 09/19/19 0618 09/20/19 0553 09/21/19 0126  NA 132* 133*  137 133* 135  K 3.5 3.8 3.6 2.9* 2.9*  CL 102 105 107 104 104  CO2 19* 18* 19* 21* 21*  GLUCOSE 107* 120* 123* 99 103*  BUN 7 6 <5* 6 <5*  CREATININE 0.83 0.79 0.71 0.78 0.70  CALCIUM 9.4 8.9 9.2 8.6* 8.4*  MG  --  1.7 1.8 1.5* 2.0  PHOS  --  3.1 2.7 <1.0* 2.6   GFR: Estimated Creatinine Clearance: 119.1 mL/min (by C-G formula based on SCr of 0.7 mg/dL). Liver Function Tests: Recent Labs  Lab 09/17/19 1452 09/18/19 0555 09/19/19 0618 09/20/19 0553 09/21/19 0126  AST 52* 47* 38 79* 133*  ALT 47* 38 38 64* 113*   ALKPHOS 183* 141* 152* 159* 168*  BILITOT 1.2 1.6* 1.4* 0.7 0.9  PROT 6.7 5.1* 5.4* 5.2* 5.1*  ALBUMIN 2.8* 2.2* 2.2* 2.1* 2.2*   Recent Labs  Lab 09/17/19 1452  LIPASE 36   No results for input(s): AMMONIA in the last 168 hours. Coagulation Profile: No results for input(s): INR, PROTIME in the last 168 hours. Cardiac Enzymes: No results for input(s): CKTOTAL, CKMB, CKMBINDEX, TROPONINI in the last 168 hours. BNP (last 3 results) No results for input(s): PROBNP in the last 8760 hours. HbA1C: No results for input(s): HGBA1C in the last 72 hours. CBG: No results for input(s): GLUCAP in the last 168 hours. Lipid Profile: No results for input(s): CHOL, HDL, LDLCALC, TRIG, CHOLHDL, LDLDIRECT in the last 72 hours. Thyroid Function Tests: No results for input(s): TSH, T4TOTAL, FREET4, T3FREE, THYROIDAB in the last 72 hours. Anemia Panel: Recent Labs    09/20/19 0553 09/21/19 0126  FERRITIN 69 96   Urine analysis: No results found for: COLORURINE, APPEARANCEUR, LABSPEC, PHURINE, GLUCOSEU, HGBUR, BILIRUBINUR, KETONESUR, PROTEINUR, UROBILINOGEN, NITRITE, LEUKOCYTESUR Sepsis Labs: @LABRCNTIP (procalcitonin:4,lacticacidven:4)  )No results found for this or any previous visit (from the past 240 hour(s)).       Radiology Studies: No results found.      Scheduled Meds: . heparin  5,000 Units Subcutaneous Q8H  . phosphorus  500 mg Oral TID WC & HS  . prenatal multivitamin  1 tablet Oral Q1200  . sodium chloride flush  3 mL Intravenous Q12H   Continuous Infusions: . sodium chloride Stopped (09/17/19 2135)  . remdesivir 100 mg in NS 250 mL 100 mg (09/21/19 1038)     LOS: 4 days    Time spent: 25 minutes    Edwin Dada, MD [facility] 09/21/2019, 4:33 PM     Please page though [place] or Epic secure chat:  For [employer], Adult nurse

## 2019-09-22 LAB — CBC WITH DIFFERENTIAL/PLATELET
Abs Immature Granulocytes: 0.46 10*3/uL — ABNORMAL HIGH (ref 0.00–0.07)
Basophils Absolute: 0 10*3/uL (ref 0.0–0.1)
Basophils Relative: 1 %
Eosinophils Absolute: 0 10*3/uL (ref 0.0–0.5)
Eosinophils Relative: 0 %
HCT: 33.3 % — ABNORMAL LOW (ref 36.0–46.0)
Hemoglobin: 11.1 g/dL — ABNORMAL LOW (ref 12.0–15.0)
Immature Granulocytes: 8 %
Lymphocytes Relative: 43 %
Lymphs Abs: 2.4 10*3/uL (ref 0.7–4.0)
MCH: 27.5 pg (ref 26.0–34.0)
MCHC: 33.3 g/dL (ref 30.0–36.0)
MCV: 82.6 fL (ref 80.0–100.0)
Monocytes Absolute: 0.4 10*3/uL (ref 0.1–1.0)
Monocytes Relative: 7 %
Neutro Abs: 2.3 10*3/uL (ref 1.7–7.7)
Neutrophils Relative %: 41 %
Platelets: 209 10*3/uL (ref 150–400)
RBC: 4.03 MIL/uL (ref 3.87–5.11)
RDW: 13.1 % (ref 11.5–15.5)
WBC: 5.4 10*3/uL (ref 4.0–10.5)
nRBC: 0 % (ref 0.0–0.2)

## 2019-09-22 LAB — MAGNESIUM: Magnesium: 1.9 mg/dL (ref 1.7–2.4)

## 2019-09-22 LAB — PHOSPHORUS: Phosphorus: 3.3 mg/dL (ref 2.5–4.6)

## 2019-09-22 LAB — COMPREHENSIVE METABOLIC PANEL
ALT: 104 U/L — ABNORMAL HIGH (ref 0–44)
AST: 97 U/L — ABNORMAL HIGH (ref 15–41)
Albumin: 2.1 g/dL — ABNORMAL LOW (ref 3.5–5.0)
Alkaline Phosphatase: 183 U/L — ABNORMAL HIGH (ref 38–126)
Anion gap: 10 (ref 5–15)
BUN: <5 mg/dL — ABNORMAL LOW (ref 6–20)
CO2: 20 mmol/L — ABNORMAL LOW (ref 22–32)
Calcium: 8.4 mg/dL — ABNORMAL LOW (ref 8.9–10.3)
Chloride: 106 mmol/L (ref 98–111)
Creatinine, Ser: 0.64 mg/dL (ref 0.44–1.00)
GFR calc Af Amer: >60 mL/min (ref >60)
GFR calc non Af Amer: >60 mL/min (ref >60)
Glucose, Bld: 92 mg/dL (ref 70–99)
Potassium: 3.6 mmol/L (ref 3.5–5.1)
Sodium: 136 mmol/L (ref 135–145)
Total Bilirubin: 0.7 mg/dL (ref 0.3–1.2)
Total Protein: 5.2 g/dL — ABNORMAL LOW (ref 6.5–8.1)

## 2019-09-22 LAB — FERRITIN: Ferritin: 103 ng/mL (ref 11–307)

## 2019-09-22 LAB — C-REACTIVE PROTEIN: CRP: 5.2 mg/dL — ABNORMAL HIGH (ref <1.0)

## 2019-09-22 LAB — D-DIMER, QUANTITATIVE: D-Dimer, Quant: 1.04 ug/mL-FEU — ABNORMAL HIGH (ref 0.00–0.50)

## 2019-09-22 MED ORDER — ENOXAPARIN SODIUM 60 MG/0.6ML ~~LOC~~ SOLN
0.5000 mg/kg | SUBCUTANEOUS | Status: DC
  Administered 2019-09-22 – 2019-09-23 (×2): 55 mg via SUBCUTANEOUS
  Filled 2019-09-22 (×2): qty 0.6

## 2019-09-22 MED ORDER — FAMOTIDINE 20 MG PO TABS
20.0000 mg | ORAL_TABLET | Freq: Every day | ORAL | Status: DC
  Administered 2019-09-22 – 2019-09-24 (×3): 20 mg via ORAL
  Filled 2019-09-22 (×3): qty 1

## 2019-09-22 MED ORDER — ACETAMINOPHEN 325 MG PO TABS: 325.0000 mg | ORAL_TABLET | Freq: Three times a day (TID) | ORAL | Status: DC | PRN

## 2019-09-22 NOTE — Progress Notes (Signed)
Pt is 31w 6d today and says that she feels "about the same" She has no complaints of LOF, vaginal bleeding, or abdominal cramping.  She also reports normal fetal movement.  Per hospitalist, her liver enzymes are elevated but stable.  NST is reactive and reassuring for this gestation.  Dr Helane Rima made aware.

## 2019-09-22 NOTE — Progress Notes (Signed)
PROGRESS NOTE    Julia Ibarra  [id number] DOB: 08/30/1976 DOA: 09/17/2019 PCP: Flossie Buffy, NP   Brief Narrative: 43 year old history G3P2 female at [facility] with no significant past medical history presents with cough, shortness of breath, nausea generalized body aches and recent exposure to COVID-19. In the ED patient desaturated to the mid 80s on room air.,  X-ray showed no focal airspace disease or opacity.  Transaminases were slightly elevated.  SARS Covid 2 PCR positive   Assessment & Plan:   Principal Problem:   Acute respiratory failure with hypoxia (HCC) Active Problems:   Hypovolemia associated with vomiting   Pregnancy   COVID-19 virus infection  1-coronavirus pneumonitis with acute hypoxic respiratory failure: Patient presented with severe hypoxemia, oxygen in the 80s, chest x-ray was clear , she has remained off of oxygen for the last 72 hours. Continue with remdesivir day 3 of 5 Continue with Decadron day 5 Tylenol as needed for pain and tessalon PRN CRP increasing.  Continue with Decadron ans remdesivir.   2-supervision of normal pregnancy OB consulted.  Hypokalemia: Replace.  Anemia: Hemoglobin stable GERD: start pepcid.   Hypophosphatemia; replaced. Stop supplement and monitor.  Transaminases; trending down. Monitor.   Estimated body mass index is 40.85 kg/m as calculated from the following:   Height as of this encounter: 5\' 6"  (1.676 m).   Weight as of this encounter: 114.8 kg.   DVT prophylaxis: lovenox Code Status: full code Family Communication:care discussed with patient.  Disposition Plan: remain in the hospital to received redemsivir.   Consultants:   OB  Procedures:   None  Antimicrobials:  None  Subjective: She is feeling the same, tired. She denies dyspnea, chest pain.   Objective: Vitals:   09/21/19 1815 09/21/19 2020 09/22/19 0415 09/22/19 0807  BP:  118/70 114/68 102/76  Pulse:  98 95 (!) 107  Resp: 20      Temp:  98.9 F (37.2 C) 98.9 F (37.2 C) 97.7 F (36.5 C)  TempSrc:  Oral Oral Oral  SpO2:  100% 97% 98%  Weight:      Height:        Intake/Output Summary (Last 24 hours) at 09/22/2019 1512 Last data filed at 09/22/2019 0600 Gross per 24 hour  Intake 480 ml  Output 1 ml  Net 479 ml   Filed Weights   09/17/19 1438 09/18/19 2015  Weight: 113 kg 114.8 kg    Examination:  General exam: Appears calm and comfortable  Respiratory system: Clear to auscultation. Respiratory effort normal. Cardiovascular system: S1 & S2 heard, RRR.  Gastrointestinal system: Abdomen is distended, soft and nontender. No organomegaly or masses felt. Normal bowel sounds heard. Central nervous system: Alert and oriented. No focal neurological deficits. Extremities: Symmetric 5 x 5 power. Skin: No rashes, lesions or ulcers     Data Reviewed: I have personally reviewed following labs and imaging studies  CBC: Recent Labs  Lab 09/18/19 0555 09/19/19 0618 09/20/19 0553 09/21/19 0126 09/22/19 0401  WBC 4.0 5.8 7.5 5.9 5.4  NEUTROABS 2.2 3.7 4.3 3.3 2.3  HGB 10.9* 10.7* 10.3* 10.7* 11.1*  HCT 33.1* 32.3* 31.2* 31.4* 33.3*  MCV 84.2 83.7 83.4 81.8 82.6  PLT 200 216 199 211 [id number]   Basic Metabolic Panel: Recent Labs  Lab 09/18/19 0555 09/19/19 0618 09/20/19 0553 09/21/19 0126 09/22/19 0401  NA 133* 137 133* 135 136  K 3.8 3.6 2.9* 2.9* 3.6  CL 105 107 104 104 106  CO2 18* 19*  21* 21* 20*  GLUCOSE 120* 123* 99 103* 92  BUN 6 <5* 6 <5* <5*  CREATININE 0.79 0.71 0.78 0.70 0.64  CALCIUM 8.9 9.2 8.6* 8.4* 8.4*  MG 1.7 1.8 1.5* 2.0 1.9  PHOS 3.1 2.7 <1.0* 2.6 3.3   GFR: Estimated Creatinine Clearance: 119.1 mL/min (by C-G formula based on SCr of 0.64 mg/dL). Liver Function Tests: Recent Labs  Lab 09/18/19 0555 09/19/19 0618 09/20/19 0553 09/21/19 0126 09/22/19 0401  AST 47* 38 79* 133* 97*  ALT 38 38 64* 113* 104*  ALKPHOS 141* 152* 159* 168* 183*  BILITOT 1.6* 1.4* 0.7 0.9 0.7   PROT 5.1* 5.4* 5.2* 5.1* 5.2*  ALBUMIN 2.2* 2.2* 2.1* 2.2* 2.1*   Recent Labs  Lab 09/17/19 1452  LIPASE 36   No results for input(s): AMMONIA in the last 168 hours. Coagulation Profile: No results for input(s): INR, PROTIME in the last 168 hours. Cardiac Enzymes: No results for input(s): CKTOTAL, CKMB, CKMBINDEX, TROPONINI in the last 168 hours. BNP (last 3 results) No results for input(s): PROBNP in the last 8760 hours. HbA1C: No results for input(s): HGBA1C in the last 72 hours. CBG: No results for input(s): GLUCAP in the last 168 hours. Lipid Profile: No results for input(s): CHOL, HDL, LDLCALC, TRIG, CHOLHDL, LDLDIRECT in the last 72 hours. Thyroid Function Tests: No results for input(s): TSH, T4TOTAL, FREET4, T3FREE, THYROIDAB in the last 72 hours. Anemia Panel: Recent Labs    09/21/19 0126 09/22/19 0401  FERRITIN 96 103   Sepsis Labs: No results for input(s): PROCALCITON, LATICACIDVEN in the last 168 hours.  No results found for this or any previous visit (from the past 240 hour(s)).       Radiology Studies: No results found.      Scheduled Meds: . dexamethasone (DECADRON) injection  6 mg Intravenous Daily  . enoxaparin (LOVENOX) injection  0.5 mg/kg Subcutaneous Q24H  . famotidine  20 mg Oral Daily  . phosphorus  500 mg Oral TID WC & HS  . prenatal multivitamin  1 tablet Oral Q1200  . sodium chloride flush  3 mL Intravenous Q12H   Continuous Infusions: . sodium chloride Stopped (09/17/19 2135)  . remdesivir 100 mg in NS 250 mL 100 mg (09/22/19 0922)     LOS: 5 days    Time spent: 35 minutes.     Elmarie Shiley, MD [facility]   If 7PM-7AM, please contact night-coverage [email or web address] Password [email or web address] 09/22/2019, 3:12 PM

## 2019-09-23 LAB — COMPREHENSIVE METABOLIC PANEL
ALT: 90 U/L — ABNORMAL HIGH (ref 0–44)
AST: 68 U/L — ABNORMAL HIGH (ref 15–41)
Albumin: 2.1 g/dL — ABNORMAL LOW (ref 3.5–5.0)
Alkaline Phosphatase: 196 U/L — ABNORMAL HIGH (ref 38–126)
Anion gap: 8 (ref 5–15)
BUN: 6 mg/dL (ref 6–20)
CO2: 21 mmol/L — ABNORMAL LOW (ref 22–32)
Calcium: 8.8 mg/dL — ABNORMAL LOW (ref 8.9–10.3)
Chloride: 108 mmol/L (ref 98–111)
Creatinine, Ser: 0.61 mg/dL (ref 0.44–1.00)
GFR calc Af Amer: >60 mL/min (ref >60)
GFR calc non Af Amer: >60 mL/min (ref >60)
Glucose, Bld: 102 mg/dL — ABNORMAL HIGH (ref 70–99)
Potassium: 4.2 mmol/L (ref 3.5–5.1)
Sodium: 137 mmol/L (ref 135–145)
Total Bilirubin: 0.4 mg/dL (ref 0.3–1.2)
Total Protein: 5 g/dL — ABNORMAL LOW (ref 6.5–8.1)

## 2019-09-23 LAB — C-REACTIVE PROTEIN: CRP: 2.7 mg/dL — ABNORMAL HIGH (ref <1.0)

## 2019-09-23 LAB — HEPATITIS PANEL, ACUTE
HCV Ab: NONREACTIVE
Hep A IgM: NONREACTIVE
Hep B C IgM: NONREACTIVE
Hepatitis B Surface Ag: NONREACTIVE

## 2019-09-23 LAB — MAGNESIUM: Magnesium: 2 mg/dL (ref 1.7–2.4)

## 2019-09-23 LAB — PHOSPHORUS: Phosphorus: 2.9 mg/dL (ref 2.5–4.6)

## 2019-09-23 MED ORDER — SODIUM CHLORIDE 0.9 % IV BOLUS
500.0000 mL | Freq: Once | INTRAVENOUS | Status: AC
  Administered 2019-09-23: 500 mL via INTRAVENOUS

## 2019-09-23 NOTE — TOC Initial Note (Signed)
Transition of Care [facility]) - Initial/Assessment Note    Patient Details  Name: Julia Ibarra MRN: [id number] Date of Birth: 1976-04-26  [facility]) CM/SW Contact:    Maryclare Labrador, RN Phone Number: 09/23/2019, 3:55 PM  Clinical Narrative:    PTA independent from home with spouse.  Pt is [redacted] weeks pregnant positive with COVID.  Pt has PCP and denied barriers with paying for medications.  TOC will continue to follow               Expected Discharge Plan: Home/Self Care Barriers to Discharge: Continued Medical Work up   Patient Goals and CMS Choice        Expected Discharge Plan and Services Expected Discharge Plan: Home/Self Care       Living arrangements for the past 2 months: Single Family Home                                      Prior Living Arrangements/Services Living arrangements for the past 2 months: Single Family Home Lives with:: Spouse Patient language and need for interpreter reviewed:: Yes Do you feel safe going back to the place where you live?: Yes      Need for Family Participation in Patient Care: Yes (Comment) Care giver support system in place?: Yes (comment)   Criminal Activity/Legal Involvement Pertinent to Current Situation/Hospitalization: No - Comment as needed  Activities of Daily Living Home Assistive Devices/Equipment: None ADL Screening (condition at time of admission) Patient's cognitive ability adequate to safely complete daily activities?: Yes Is the patient deaf or have difficulty hearing?: No Does the patient have difficulty seeing, even when wearing glasses/contacts?: No Does the patient have difficulty concentrating, remembering, or making decisions?: No Patient able to express need for assistance with ADLs?: Yes Does the patient have difficulty dressing or bathing?: No Independently performs ADLs?: Yes (appropriate for developmental age) Does the patient have difficulty walking or climbing stairs?:  No Weakness of Legs: None Weakness of Arms/Hands: None  Permission Sought/Granted                  Emotional Assessment   Attitude/Demeanor/Rapport: Self-Confident, Engaged Affect (typically observed): Accepting Orientation: : Oriented to Self, Oriented to Place, Oriented to  Time, Oriented to Situation   Psych Involvement: No (comment)  Admission diagnosis:  Hypoxia [R09.02] Non-intractable vomiting with nausea, unspecified vomiting type [R11.2] COVID-19 virus infection [U07.1] Acute respiratory failure with hypoxia ([place]) [J96.01] Patient Active Problem List   Diagnosis Date Noted  . Acute respiratory failure with hypoxia ([place]) 09/17/2019  . Hypovolemia associated with vomiting 09/17/2019  . Pregnancy 09/17/2019  . COVID-19 virus infection 09/17/2019  . Other fatigue 12/03/2017  . Shortness of breath on exertion 12/03/2017  . Elevated blood pressure reading 12/03/2017   PCP:  Flossie Buffy, NP Pharmacy:   [facility] [place] [address] Phone: [phone] Fax: [phone]     Social Determinants of Health (SDOH) Interventions    Readmission Risk Interventions No flowsheet data found.

## 2019-09-23 NOTE — Progress Notes (Signed)
1605 Daily NST completed. Pt denies vaginal bleeding, LOF or UC's and reports good fetal movement. Pt is feeling somewhat better and is looking forward to being able to go home tomorrow. No SOB noted or reported.

## 2019-09-23 NOTE — Progress Notes (Addendum)
When taking pt vitals, BP: 95/66 and temp: 94.4. Temp was taken on two different thermometers, and both came to 94.4. Patient denied feeling cool instead she stated she felt warm. Dr. Hartford Poli was paged about patient's status. Dr. Hartford Poli ordered a 500 cc bolus of NS. Bolus infusing now.

## 2019-09-23 NOTE — Consult Note (Signed)
   [facility] Inpatient Consult   09/23/2019  Julia Ibarra 07/05/1976 [id number]   [facility] Active Status: Pending  Patient is in the [facility] and will be followed by the [place] Coordinator for the plan to assess for transition of care needs.  Plan: Will follow for progress and needs.  Follow up with inpatient [facility] team for transition of care follow up or barriers to care needs.  For questions, please contact:  Natividad Brood, RN BSN [facility] Liaison  [phone] business mobile phone Toll free office [phone]  Fax number: [phone] [name]

## 2019-09-23 NOTE — Progress Notes (Signed)
SATURATION QUALIFICATIONS: (This note is used to comply with regulatory documentation for home oxygen)  Patient Saturations on Room Air at Rest = 98%  Patient Saturations on Room Air while Ambulating = 98%  Patient ambulated around room without any difficulty. Completely independent. Does not require O2.

## 2019-09-23 NOTE — Progress Notes (Signed)
PROGRESS NOTE    Julia Ibarra  [id number] DOB: Jan 24, 1976 DOA: 09/17/2019 PCP: Flossie Buffy, NP   Brief Narrative: 43 year old history G3P2 female at [facility] with no significant past medical history presents with cough, shortness of breath, nausea generalized body aches and recent exposure to COVID-19. In the ED patient desaturated to the mid 80s on room air.,  X-ray showed no focal airspace disease or opacity.  Transaminases were slightly elevated.  SARS Covid 2 PCR positive   Assessment & Plan:   Principal Problem:   Acute respiratory failure with hypoxia (HCC) Active Problems:   Hypovolemia associated with vomiting   Pregnancy   COVID-19 virus infection  1-Coronavirus pneumonitis with acute hypoxic respiratory failure: Patient presented with severe hypoxemia, oxygen in the 80s, chest x-ray was clear , she has remained off of oxygen for the last 72 hours. Continue with remdesivir day 4 of 5 Continue with Decadron day 6/10 Tylenol as needed for pain and tessalon PRN CRP increasing.  Continue with Decadron ans remdesivir.  She will need to quarantine for 21 days, since symptoms started.  Test positive 11/27.  2-Supervision of normal pregnancy OB consulted. Patient report feeling baby.   Hypokalemia: Resolved.   Anemia: Hemoglobin stable GERD: Continue with  pepcid.   Hypophosphatemia; replaced. Stop supplement and monitor.   Transaminases; trending down. Monitor. Alkaline phosphatase increased today , patient denies abdominal pain.  Discussed with GI, will get Acute hepatitis  Panel, HSV and EBS virus DNA by PCR.    Estimated body mass index is 40.85 kg/m as calculated from the following:   Height as of this encounter: 5\' 6"  (1.676 m).   Weight as of this encounter: 114.8 kg.   DVT prophylaxis: lovenox Code Status: full code Family Communication:care discussed with patient.  Disposition Plan: remain in the hospital to received redemsivir.    Consultants:   OB  Procedures:   None  Antimicrobials:  None  Subjective: She is feeling better. Cough has improved. She is breathing well.    Objective: Vitals:   09/22/19 2030 09/23/19 0500 09/23/19 0700 09/23/19 0724  BP: 102/72 95/66 106/66 103/72  Pulse: 85 74 84 75  Resp:    16  Temp: 97.7 F (36.5 C) (!) 94.5 F (34.7 C) 97.7 F (36.5 C) (!) 97.5 F (36.4 C)  TempSrc: Oral Oral Oral Oral  SpO2: 97% 98% 99% 98%  Weight:      Height:        Intake/Output Summary (Last 24 hours) at 09/23/2019 1436 Last data filed at 09/23/2019 0600 Gross per 24 hour  Intake 1160 ml  Output -  Net 1160 ml   Filed Weights   09/17/19 1438 09/18/19 2015  Weight: 113 kg 114.8 kg    Examination:  General exam:  NAD Respiratory system: CTA Cardiovascular system: S 1, S 2 RRR Gastrointestinal system: Abdomen distended from pregnancy, nt,  Central nervous system: alert and oriented Extremities: Symmetric power Skin: No rashes.      Data Reviewed: I have personally reviewed following labs and imaging studies  CBC: Recent Labs  Lab 09/18/19 0555 09/19/19 0618 09/20/19 0553 09/21/19 0126 09/22/19 0401  WBC 4.0 5.8 7.5 5.9 5.4  NEUTROABS 2.2 3.7 4.3 3.3 2.3  HGB 10.9* 10.7* 10.3* 10.7* 11.1*  HCT 33.1* 32.3* 31.2* 31.4* 33.3*  MCV 84.2 83.7 83.4 81.8 82.6  PLT 200 216 199 211 [id number]   Basic Metabolic Panel: Recent Labs  Lab 09/19/19 0618 09/20/19 0553 09/21/19 0126 09/22/19  0401 09/23/19 0508  NA 137 133* 135 136 137  K 3.6 2.9* 2.9* 3.6 4.2  CL 107 104 104 106 108  CO2 19* 21* 21* 20* 21*  GLUCOSE 123* 99 103* 92 102*  BUN <5* 6 <5* <5* 6  CREATININE 0.71 0.78 0.70 0.64 0.61  CALCIUM 9.2 8.6* 8.4* 8.4* 8.8*  MG 1.8 1.5* 2.0 1.9 2.0  PHOS 2.7 <1.0* 2.6 3.3 2.9   GFR: Estimated Creatinine Clearance: 119.1 mL/min (by C-G formula based on SCr of 0.61 mg/dL). Liver Function Tests: Recent Labs  Lab 09/19/19 0618 09/20/19 0553 09/21/19 0126 09/22/19  0401 09/23/19 0508  AST 38 79* 133* 97* 68*  ALT 38 64* 113* 104* 90*  ALKPHOS 152* 159* 168* 183* 196*  BILITOT 1.4* 0.7 0.9 0.7 0.4  PROT 5.4* 5.2* 5.1* 5.2* 5.0*  ALBUMIN 2.2* 2.1* 2.2* 2.1* 2.1*   Recent Labs  Lab 09/17/19 1452  LIPASE 36   No results for input(s): AMMONIA in the last 168 hours. Coagulation Profile: No results for input(s): INR, PROTIME in the last 168 hours. Cardiac Enzymes: No results for input(s): CKTOTAL, CKMB, CKMBINDEX, TROPONINI in the last 168 hours. BNP (last 3 results) No results for input(s): PROBNP in the last 8760 hours. HbA1C: No results for input(s): HGBA1C in the last 72 hours. CBG: No results for input(s): GLUCAP in the last 168 hours. Lipid Profile: No results for input(s): CHOL, HDL, LDLCALC, TRIG, CHOLHDL, LDLDIRECT in the last 72 hours. Thyroid Function Tests: No results for input(s): TSH, T4TOTAL, FREET4, T3FREE, THYROIDAB in the last 72 hours. Anemia Panel: Recent Labs    09/21/19 0126 09/22/19 0401  FERRITIN 96 103   Sepsis Labs: No results for input(s): PROCALCITON, LATICACIDVEN in the last 168 hours.  No results found for this or any previous visit (from the past 240 hour(s)).       Radiology Studies: No results found.      Scheduled Meds: . dexamethasone (DECADRON) injection  6 mg Intravenous Daily  . enoxaparin (LOVENOX) injection  0.5 mg/kg Subcutaneous Q24H  . famotidine  20 mg Oral Daily  . prenatal multivitamin  1 tablet Oral Q1200  . sodium chloride flush  3 mL Intravenous Q12H   Continuous Infusions: . sodium chloride Stopped (09/17/19 2135)  . remdesivir 100 mg in NS 250 mL 100 mg (09/23/19 1026)     LOS: 6 days    Time spent: 35 minutes.     Elmarie Shiley, MD [facility]   If 7PM-7AM, please contact night-coverage [email or web address] Password [email or web address] 09/23/2019, 2:36 PM

## 2019-09-24 MED ORDER — SODIUM CHLORIDE 0.9 % IV SOLN
100.0000 mg | Freq: Every day | INTRAVENOUS | Status: AC
  Administered 2019-09-24: 100 mg via INTRAVENOUS
  Filled 2019-09-24: qty 20

## 2019-09-24 MED ORDER — FAMOTIDINE 20 MG PO TABS: 20.0000 mg | ORAL_TABLET | Freq: Every day | ORAL | 0 refills | Status: DC

## 2019-09-24 MED ORDER — BENZONATATE 100 MG PO CAPS: 100.0000 mg | ORAL_CAPSULE | ORAL | 0 refills | Status: DC | PRN

## 2019-09-24 MED ORDER — DEXAMETHASONE 6 MG PO TABS: 6.0000 mg | ORAL_TABLET | Freq: Every day | ORAL | 0 refills | Status: AC

## 2019-09-24 NOTE — Progress Notes (Addendum)
Daily NST performed so that pt can dc home.  FHT tracing is reactive and reassuring for this gestation.  PT has no complaints of SOB, LOF, vaginal bleeding, or abdominal cramping.  Pt instructed to call OB should she experience any of these things.  Pt excited about getting to go home today.  Dr Corinna Capra made aware.  MD ok with patient being discharged from an OB stand point.

## 2019-09-24 NOTE — Discharge Summary (Signed)
Physician Discharge Summary  JACKILYN GROVEN [id number] DOB: [birth year] DOA: 09/17/2019  PCP: Flossie Buffy, NP  Admit date: 09/17/2019 Discharge date: 09/24/2019  Admitted From: Home  Disposition: Home   Recommendations for Outpatient Follow-up:  1. Follow up with PCP in 1-2 weeks 2. Please obtain BMP/CBC in one week 3. Please follow up on the following pending results: EBS and Herpes simplex PCR.  4. Needs repeat LFT next week to monitor level.  5. Follow up with DR Paulita Fujita for transaminases and Viral test results.  6. Follow up With OB  Home Health: none  Discharge Condition: Stable, improved CODE STATUS: Full code Diet recommendation: Regular diet.   Brief/Interim Summary: 43 year old history G3P2 female at [place] with no significant past medical history presents with cough, shortness of breath, nausea generalized body aches and recent exposure to COVID-19. In the ED patient desaturated to the mid 80s on room air.,  X-ray showed no focal airspace disease or opacity. Transaminases were slightly elevated.  SARS Covid 2 PCR positive  1-Coronavirus pneumonitis with acute hypoxic respiratory failure: Patient presented with severe hypoxemia, oxygen in the 80s, chest x-ray was clear , she has remained off of oxygen for the last 72 hours. Received  remdesivir for 5 days.  Continue with Decadron day 8/10, will provide two more days.  Tylenol as needed for pain and tessalon PRN CRP decreased. She will need to quarantine for 21 days, since symptoms started. Stop Quarantine 10-02-2019 Test positive 11/27.  2-Supervision of normal pregnancy OB consulted. OB following. Stable for discharge  Hypokalemia: Resolved.   Anemia: Hemoglobin stable GERD: Continue with  pepcid.   Hypophosphatemia; replaced. Stop supplement and monitor.   Transaminases; trending down. Monitor. Alkaline phosphatase increased 12-03. , patient denies abdominal pain.  Discussed with GI, LFT  trending might reflect recovery phase.  He recommend to check Acute hepatitis  Panel which was negative. , HSV and EBS virus DNA by PCR pending.     Discharge Diagnoses:  Principal Problem:   Acute respiratory failure with hypoxia ([place]) Active Problems:   Hypovolemia associated with vomiting   Pregnancy   COVID-19 virus infection    Discharge Instructions  Discharge Instructions    Diet general   Complete by: As directed    Increase activity slowly   Complete by: As directed    MyChart COVID-19 home monitoring program   Complete by: Sep 24, 2019    Is the patient willing to use the [facility] for home monitoring?: Yes   Temperature monitoring   Complete by: Sep 24, 2019    After how many days would you like to receive a notification of this patient's flowsheet entries?: 1     Allergies as of 09/24/2019   No Known Allergies     Medication List    STOP taking these medications   acetaminophen 500 MG tablet Commonly known as: TYLENOL   Lorcaserin HCl ER 20 MG Tb24 Commonly known as: Belviq XR   vitamin B-12 1000 MCG tablet Commonly known as: CYANOCOBALAMIN   Vitamin D (Cholecalciferol) 25 MCG (1000 UT) Caps     TAKE these medications   benzonatate 100 MG capsule Commonly known as: TESSALON Take 1 capsule (100 mg total) by mouth every 4 (four) hours as needed for cough.   dexamethasone 6 MG tablet Commonly known as: DECADRON Take 1 tablet (6 mg total) by mouth daily for 2 days. Notes to patient: 09/25/2019 AM   famotidine 20 MG tablet Commonly known  as: PEPCID Take 1 tablet (20 mg total) by mouth daily. Start taking on: September 25, 2019   prenatal multivitamin Tabs tablet Take 1 tablet by mouth daily at 12 noon. Notes to patient: 09/25/2019 AM      Follow-up Information    Arta Silence, MD Follow up in 1 week(s).   Specialty: Gastroenterology Why: call office to arrange televisit.  Contact information: [address] [place] [address] [phone]        Flossie Buffy, NP Follow up in 1 week(s).   Specialty: Internal Medicine Why: you need lab work to follow your liver enzymes level.  Contact information: [place] [address] [phone]          No Known Allergies  Consultations:  OB  GI phone consultation    Procedures/Studies: Dg Chest Portable 1 View  Result Date: 09/17/2019 CLINICAL DATA:  Shortness of breath.  COVID-19 positive. EXAM: PORTABLE CHEST 1 VIEW COMPARISON:  September 11, 2019. FINDINGS: The heart size and mediastinal contours are within normal limits. Both lungs are clear. No pneumothorax or pleural effusion is noted. The visualized skeletal structures are unremarkable. IMPRESSION: No active disease. Electronically Signed   By: Marijo Conception M.D.   On: 09/17/2019 20:14   Dg Chest Portable 1 View  Result Date: 09/11/2019 CLINICAL DATA:  Cough and congestion EXAM: PORTABLE CHEST 1 VIEW COMPARISON:  None. FINDINGS: The heart size and mediastinal contours are within normal limits. Both lungs are clear. The visualized skeletal structures are unremarkable. IMPRESSION: No active disease. Electronically Signed   By: Donavan Foil M.D.   On: 09/11/2019 19:02     Subjective: She is feeling well, mild cough. Happy that she is going home  Discharge Exam: Vitals:   09/24/19 0417 09/24/19 0724  BP: 116/75 115/82  Pulse: 77 71  Resp: 16 20  Temp: 97.6 F (36.4 C) 97.7 F (36.5 C)  SpO2: 100% 98%     General: Pt is alert, awake, not in acute distress Cardiovascular: RRR, S1/S2 +, no rubs, no gallops Respiratory: CTA bilaterally, no wheezing, no rhonchi Abdominal: Soft, NT, Distened, bowel sounds + Extremities: no edema, no cyanosis    The results of significant diagnostics from this hospitalization (including imaging, microbiology, ancillary and laboratory) are listed below for reference.     Microbiology: No results  found for this or any previous visit (from the past 240 hour(s)).   Labs: BNP (last 3 results) No results for input(s): BNP in the last 8760 hours. Basic Metabolic Panel: Recent Labs  Lab 09/19/19 0618 09/20/19 0553 09/21/19 0126 09/22/19 0401 09/23/19 0508  NA 137 133* 135 136 137  K 3.6 2.9* 2.9* 3.6 4.2  CL 107 104 104 106 108  CO2 19* 21* 21* 20* 21*  GLUCOSE 123* 99 103* 92 102*  BUN <5* 6 <5* <5* 6  CREATININE 0.71 0.78 0.70 0.64 0.61  CALCIUM 9.2 8.6* 8.4* 8.4* 8.8*  MG 1.8 1.5* 2.0 1.9 2.0  PHOS 2.7 <1.0* 2.6 3.3 2.9   Liver Function Tests: Recent Labs  Lab 09/19/19 0618 09/20/19 0553 09/21/19 0126 09/22/19 0401 09/23/19 0508  AST 38 79* 133* 97* 68*  ALT 38 64* 113* 104* 90*  ALKPHOS 152* 159* 168* 183* 196*  BILITOT 1.4* 0.7 0.9 0.7 0.4  PROT 5.4* 5.2* 5.1* 5.2* 5.0*  ALBUMIN 2.2* 2.1* 2.2* 2.1* 2.1*   No results for input(s): LIPASE, AMYLASE in the last 168 hours. No results  for input(s): AMMONIA in the last 168 hours. CBC: Recent Labs  Lab 09/18/19 0555 09/19/19 0618 09/20/19 0553 09/21/19 0126 09/22/19 0401  WBC 4.0 5.8 7.5 5.9 5.4  NEUTROABS 2.2 3.7 4.3 3.3 2.3  HGB 10.9* 10.7* 10.3* 10.7* 11.1*  HCT 33.1* 32.3* 31.2* 31.4* 33.3*  MCV 84.2 83.7 83.4 81.8 82.6  PLT 200 216 199 211 209   Cardiac Enzymes: No results for input(s): CKTOTAL, CKMB, CKMBINDEX, TROPONINI in the last 168 hours. BNP: Invalid input(s): POCBNP CBG: No results for input(s): GLUCAP in the last 168 hours. D-Dimer Recent Labs    09/22/19 0401  DDIMER 1.04*   Hgb A1c No results for input(s): HGBA1C in the last 72 hours. Lipid Profile No results for input(s): CHOL, HDL, LDLCALC, TRIG, CHOLHDL, LDLDIRECT in the last 72 hours. Thyroid function studies No results for input(s): TSH, T4TOTAL, T3FREE, THYROIDAB in the last 72 hours.  Invalid input(s): FREET3 Anemia work up Recent Labs    09/22/19 0401  FERRITIN 103   Urinalysis No results found for: COLORURINE,  APPEARANCEUR, LABSPEC, [place], GLUCOSEU, HGBUR, BILIRUBINUR, KETONESUR, PROTEINUR, UROBILINOGEN, NITRITE, LEUKOCYTESUR Sepsis Labs Invalid input(s): PROCALCITONIN,  WBC,  LACTICIDVEN Microbiology No results found for this or any previous visit (from the past 240 hour(s)).   Time coordinating discharge: 40 minutes  SIGNED:   Elmarie Shiley, MD  [facility]

## 2019-09-24 NOTE — Discharge Instructions (Signed)
You need to Quarantine for 21 days from  The day you started having symptoms. Until 10-02-2019.    COVID-19: How to Protect Yourself and Others Know how it spreads  There is currently no vaccine to prevent coronavirus disease 2019 (COVID-19).  The best way to prevent illness is to avoid being exposed to this virus.  The virus is thought to spread mainly from person-to-person. ? Between people who are in close contact with one another (within about 6 feet). ? Through respiratory droplets produced when an infected person coughs, sneezes or talks. ? These droplets can land in the mouths or noses of people who are nearby or possibly be inhaled into the lungs. ? Some recent studies have suggested that COVID-19 may be spread by people who are not showing symptoms. Everyone should Clean your hands often  Wash your hands often with soap and water for at least 20 seconds especially after you have been in a public place, or after blowing your nose, coughing, or sneezing.  If soap and water are not readily available, use a hand sanitizer that contains at least 60% alcohol. Cover all surfaces of your hands and rub them together until they feel dry.  Avoid touching your eyes, nose, and mouth with unwashed hands. Avoid close contact  Stay home if you are sick.  Avoid close contact with people who are sick.  Put distance between yourself and other people. ? Remember that some people without symptoms may be able to spread virus. ? This is especially important for people who are at higher risk of getting very GainPain.com.cy Cover your mouth and nose with a cloth face cover when around others  You could spread COVID-19 to others even if you do not feel sick.  Everyone should wear a cloth face cover when they have to go out in public, for example to the grocery store or to pick up other necessities. ? Cloth face coverings  should not be placed on young children under age 72, anyone who has trouble breathing, or is unconscious, incapacitated or otherwise unable to remove the mask without assistance.  The cloth face cover is meant to protect other people in case you are infected.  Do NOT use a facemask meant for a Dietitian.  Continue to keep about 6 feet between yourself and others. The cloth face cover is not a substitute for social distancing. Cover coughs and sneezes  If you are in a private setting and do not have on your cloth face covering, remember to always cover your mouth and nose with a tissue when you cough or sneeze or use the inside of your elbow.  Throw used tissues in the trash.  Immediately wash your hands with soap and water for at least 20 seconds. If soap and water are not readily available, clean your hands with a hand sanitizer that contains at least 60% alcohol. Clean and disinfect  Clean AND disinfect frequently touched surfaces daily. This includes tables, doorknobs, light switches, countertops, handles, desks, phones, keyboards, toilets, faucets, and sinks. RackRewards.fr  If surfaces are dirty, clean them: Use detergent or soap and water prior to disinfection.  Then, use a household disinfectant. You can see a list of EPA-registered household disinfectants here. michellinders.com 02/23/2019 This information is not intended to replace advice given to you by your health care provider. Make sure you discuss any questions you have with your health care provider. Document Released: 02/02/2019 Document Revised: 03/03/2019 Document Reviewed: 02/02/2019 Elsevier Patient Education  2020  [place] Frequently Asked Questions COVID-19 (coronavirus disease) is an infection that is caused by a large family of viruses. Some viruses cause illness in people and others cause illness in animals like camels,  cats, and bats. In some cases, the viruses that cause illness in animals can spread to humans. Where did the coronavirus come from? In December 2019, [place] told the [facility]) of several cases of lung disease (human respiratory illness). These cases were linked to an open seafood and livestock market in the city of [place]. The link to the seafood and livestock market suggests that the virus may have spread from animals to humans. However, since that first outbreak in December, the virus has also been shown to spread from person to person. What is the name of the disease and the virus? Disease name Early on, this disease was called novel coronavirus. This is because scientists determined that the disease was caused by a new (novel) respiratory virus. The World Health Organization [facility]) has now named the disease COVID-19, or coronavirus disease. Virus name The virus that causes the disease is called severe acute respiratory syndrome coronavirus 2 (SARS-CoV-2). More information on disease and virus naming World Health Organization [facility]): www.who.int/emergencies/diseases/novel-coronavirus-2019/technical-guidance/naming-the-coronavirus-disease-(covid-2019)-and-the-virus-that-causes-it Who is at risk for complications from coronavirus disease? Some people may be at higher risk for complications from coronavirus disease. This includes older adults and people who have chronic diseases, such as heart disease, diabetes, and lung disease. If you are at higher risk for complications, take these extra precautions:  Avoid close contact with people who are sick or have a fever or cough. Stay at least 3-6 ft (1-2 m) away from them, if possible.  Wash your hands often with soap and water for at least 20 seconds.  Avoid touching your face, mouth, nose, or eyes.  Keep supplies on hand at home, such as food, medicine, and cleaning supplies.  Stay home as much as possible.  Avoid social  gatherings and travel. How does coronavirus disease spread? The virus that causes coronavirus disease spreads easily from person to person (is contagious). There are also cases of community-spread disease. This means the disease has spread to:  People who have no known contact with other infected people.  People who have not traveled to areas where there are known cases. It appears to spread from one person to another through droplets from coughing or sneezing. Can I get the virus from touching surfaces or objects? There is still a lot that we do not know about the virus that causes coronavirus disease. Scientists are basing a lot of information on what they know about similar viruses, such as:  Viruses cannot generally survive on surfaces for long. They need a human body (host) to survive.  It is more likely that the virus is spread by close contact with people who are sick (direct contact), such as through: ? Shaking hands or hugging. ? Breathing in respiratory droplets that travel through the air. This can happen when an infected person coughs or sneezes on or near other people.  It is less likely that the virus is spread when a person touches a surface or object that has the virus on it (indirect contact). The virus may be able to enter the body if the person touches a surface or object and then touches his or her face, eyes, nose, or mouth. Can a person spread the virus without having symptoms of the disease? It may be possible for the virus  to spread before a person has symptoms of the disease, but this is most likely not the main way the virus is spreading. It is more likely for the virus to spread by being in close contact with people who are sick and breathing in the respiratory droplets of a sick person's cough or sneeze. What are the symptoms of coronavirus disease? Symptoms vary from person to person and can range from mild to severe. Symptoms may  include:  Fever.  Cough.  Tiredness, weakness, or fatigue.  Fast breathing or feeling short of breath. These symptoms can appear anywhere from 2 to 14 days after you have been exposed to the virus. If you develop symptoms, call your health care provider. People with severe symptoms may need hospital care. If I am exposed to the virus, how long does it take before symptoms start? Symptoms of coronavirus disease may appear anywhere from 2 to 14 days after a person has been exposed to the virus. If you develop symptoms, call your health care provider. Should I be tested for this virus? Your health care provider will decide whether to test you based on your symptoms, history of exposure, and your risk factors. How does a health care provider test for this virus? Health care providers will collect samples to send for testing. Samples may include:  Taking a swab of fluid from the nose.  Taking fluid from the lungs by having you cough up mucus (sputum) into a sterile cup.  Taking a blood sample.  Taking a stool or urine sample. Is there a treatment or vaccine for this virus? Currently, there is no vaccine to prevent coronavirus disease. Also, there are no medicines like antibiotics or antivirals to treat the virus. A person who becomes sick is given supportive care, which means rest and fluids. A person may also relieve his or her symptoms by using over-the-counter medicines that treat sneezing, coughing, and runny nose. These are the same medicines that a person takes for the common cold. If you develop symptoms, call your health care provider. People with severe symptoms may need hospital care. What can I do to protect myself and my family from this virus?     You can protect yourself and your family by taking the same actions that you would take to prevent the spread of other viruses. Take the following actions:  Wash your hands often with soap and water for at least 20 seconds. If soap  and water are not available, use alcohol-based hand sanitizer.  Avoid touching your face, mouth, nose, or eyes.  Cough or sneeze into a tissue, sleeve, or elbow. Do not cough or sneeze into your hand or the air. ? If you cough or sneeze into a tissue, throw it away immediately and wash your hands.  Disinfect objects and surfaces that you frequently touch every day.  Avoid close contact with people who are sick or have a fever or cough. Stay at least 3-6 ft (1-2 m) away from them, if possible.  Stay home if you are sick, except to get medical care. Call your health care provider before you get medical care.  Make sure your vaccines are up to date. Ask your health care provider what vaccines you need. What should I do if I need to travel? Follow travel recommendations from your local health authority, the CDC, and WHO. Travel information and advice  Centers for Disease Control and Prevention (CDC): BodyEditor.hu  World Health Organization [facility]): ThirdIncome.ca Know the risks and take  action to protect your health  You are at higher risk of getting coronavirus disease if you are traveling to areas with an outbreak or if you are exposed to travelers from areas with an outbreak.  Wash your hands often and practice good hygiene to lower the risk of catching or spreading the virus. What should I do if I am sick? General instructions to stop the spread of infection  Wash your hands often with soap and water for at least 20 seconds. If soap and water are not available, use alcohol-based hand sanitizer.  Cough or sneeze into a tissue, sleeve, or elbow. Do not cough or sneeze into your hand or the air.  If you cough or sneeze into a tissue, throw it away immediately and wash your hands.  Stay home unless you must get medical care. Call your health care provider or local health authority before you  get medical care.  Avoid public areas. Do not take public transportation, if possible.  If you can, wear a mask if you must go out of the house or if you are in close contact with someone who is not sick. Keep your home clean  Disinfect objects and surfaces that are frequently touched every day. This may include: ? Counters and tables. ? Doorknobs and light switches. ? Sinks and faucets. ? Electronics such as phones, remote controls, keyboards, computers, and tablets.  Wash dishes in hot, soapy water or use a dishwasher. Air-dry your dishes.  Wash laundry in hot water. Prevent infecting other household members  Let healthy household members care for children and pets, if possible. If you have to care for children or pets, wash your hands often and wear a mask.  Sleep in a different bedroom or bed, if possible.  Do not share personal items, such as razors, toothbrushes, deodorant, combs, brushes, towels, and washcloths. Where to find more information Centers for Disease Control and Prevention (CDC)  Information and news updates: https://www.butler-gonzalez.com/ World Health Organization [facility])  Information and news updates: MissExecutive.com.ee  Coronavirus health topic: https://www.castaneda.info/  Questions and answers on COVID-19: OpportunityDebt.at  Global tracker: who.sprinklr.com American Academy of Pediatrics (AAP)  Information for families: www.healthychildren.org/English/health-issues/conditions/chest-lungs/Pages/2019-Novel-Coronavirus.aspx The coronavirus situation is changing rapidly. Check your local health authority website or the CDC and [facility] websites for updates and news. When should I contact a health care provider?  Contact your health care provider if you have symptoms of an infection, such as fever or cough, and you: ? Have been near anyone who is known to have coronavirus  disease. ? Have come into contact with a person who is suspected to have coronavirus disease. ? Have traveled outside of the country. When should I get emergency medical care?  Get help right away by calling your local emergency services (911 in the [place] if you have: ? Trouble breathing. ? Pain or pressure in your chest. ? Confusion. ? Blue-tinged lips and fingernails. ? Difficulty waking from sleep. ? Symptoms that get worse. Let the emergency medical personnel know if you think you have coronavirus disease.  Summary  A new respiratory virus is spreading from person to person and causing COVID-19 (coronavirus disease).  The virus that causes COVID-19 appears to spread easily. It spreads from one person to another through droplets from coughing or sneezing.  Older adults and those with chronic diseases are at higher risk of disease. If you are at higher risk for complications, take extra precautions.  There is currently no vaccine to prevent coronavirus disease. There  are no medicines, such as antibiotics or antivirals, to treat the virus.  You can protect yourself and your family by washing your hands often, avoiding touching your face, and covering your coughs and sneezes.  This information is not intended to replace advice given to you by your health care provider. Make sure you discuss any questions you have with your health care provider. Document Released: 02/02/2019 Document Revised: 02/02/2019 Document Reviewed: 02/02/2019 Elsevier Patient Education  [place].

## 2019-09-27 ENCOUNTER — Telehealth: Payer: Self-pay | Admitting: *Deleted

## 2019-09-27 ENCOUNTER — Other Ambulatory Visit: Payer: Self-pay | Admitting: *Deleted

## 2019-09-27 NOTE — Patient Outreach (Signed)
[facility]) Care Management  09/27/2019  [name] 02/20/1976 [id number]   Transition of care telephone call  Referral received:09/20/19 Initial outreach:09/27/19 Insurance: [facility]   Initial unsuccessful telephone call to patient's preferred number in order to complete transition of care assessment; no answer, left HIPAA compliant voicemail message requesting return call.   Objective: Per the electronic medical record, Julia Ibarra  was hospitalized at [facility]  11/27-12/4/20  For covid 19 pneumonitis with acute hypoxic respiratory failure, current pregnancy. Comorbidities include: Vitamin D deficiency, history of pregnancy .  She  was discharged to home on 09/24/19  without the need for home health services or durable medical equipment per the discharge summary.   Plan: This RNCM will route unsuccessful outreach letter with [place] Management pamphlet and 24 hour Nurse Advice Line Magnet to [place] Management clinical pool to be mailed to patient's home address. This RNCM will attempt another outreach within 4 business days.  Joylene Draft, RN, [place] Management Coordinator  [phone]- Mobile [phone]- Toll Free Main Office

## 2019-09-27 NOTE — Telephone Encounter (Signed)
LVM for pt to call office to schedule appt.

## 2019-09-28 LAB — HSV DNA BY PCR (REFERENCE LAB)
HSV 1 DNA: NEGATIVE
HSV 2 DNA: NEGATIVE

## 2019-09-28 NOTE — Telephone Encounter (Signed)
Unable to reach pt. 2nd attempt. Pt did schedule hospital follow up appt 09/29/19.

## 2019-09-29 ENCOUNTER — Encounter: Payer: Self-pay | Admitting: Nurse Practitioner

## 2019-09-29 ENCOUNTER — Telehealth (INDEPENDENT_AMBULATORY_CARE_PROVIDER_SITE_OTHER): Payer: 59 | Admitting: Nurse Practitioner

## 2019-09-29 VITALS — HR 72 | Ht 66.0 in | Wt 253.0 lb

## 2019-09-29 DIAGNOSIS — R7989 Other specified abnormal findings of blood chemistry: Secondary | ICD-10-CM | POA: Diagnosis not present

## 2019-09-29 DIAGNOSIS — U071 COVID-19: Secondary | ICD-10-CM | POA: Diagnosis not present

## 2019-09-29 LAB — EPSTEIN BARR VRS(EBV DNA BY PCR)
EBV DNA QN by PCR: 286 copies/mL
log10 EBV DNA Qn PCR: 2.456 log10 copy/mL

## 2019-09-29 NOTE — Progress Notes (Signed)
Virtual Visit via Video Note  I connected with Julia Ibarra on 09/29/19 at  9:45 AM EST by a video enabled telemedicine application and verified that I am speaking with the correct person using two identifiers.  Location: Patient: home Provider: office Participants: patient and provider I discussed the limitations of evaluation and management by telemedicine and the availability of in person appointments. The patient expressed understanding and agreed to proceed.  CC: Hospital F/up, COVID positive and abnormal LFTs.  History of Present Illness: Julia Ibarra was admitted to hospital with hypovolemia, abnormal LFTs and hypoxia 09/17/19 to 09/24/19. She was diagnosed with COVID-19. Her hospitalization was complication by pregnancy. She is [redacted]weeks pregnant. Her pregnancy had been normal right up to recent illness. She was treated with Rendesevir x 5days and Decadron x 10days. Today she reports improved cough (has not needed to use benzonatate) and resolved SOB. She denies any fever but has not been able to check temperature since discharge. She had scheduled an appt with OB on 10/04/19. She is still to schedule appt with GI (Dr. Paulita Fujita)   Review of Systems  Constitutional: Negative.   Respiratory: Positive for cough. Negative for hemoptysis, sputum production, shortness of breath and wheezing.   Cardiovascular: Negative.   Gastrointestinal: Negative.   Genitourinary: Negative.   Musculoskeletal: Negative for joint pain and myalgias.  Skin: Negative.   Neurological: Negative for dizziness and headaches.   Reviewed medication list, problem list, lab and radiology results from hospital.  Observations/Objective: Physical Exam  Constitutional: She is oriented to person, place, and time. No distress.  Pulmonary/Chest: Effort normal.  Neurological: She is alert and oriented to person, place, and time.  Psychiatric: She has a normal mood and affect. Her behavior is normal. Thought content  normal.  Vitals reviewed.  Assessment and Plan: Julia Ibarra was seen today for hospitalization follow-up.  Diagnoses and all orders for this visit:  COVID-19 -     CBC with Differential/Platelet; Future -     HTN_1 BMP; Future -     Hepatic function panel; Future  Elevated LFTs -     CBC with Differential/Platelet; Future -     HTN_1 BMP; Future -     Hepatic function panel; Future   Follow Up Instructions: See avs   I discussed the assessment and treatment plan with the patient. The patient was provided an opportunity to ask questions and all were answered. The patient agreed with the plan and demonstrated an understanding of the instructions.   The patient was advised to call back or seek an in-person evaluation if the symptoms worsen or if the condition fails to improve as anticipated.   Wilfred Lacy, NP

## 2019-09-29 NOTE — Patient Instructions (Addendum)
Maintain adequate oral hydration. Maintain upcoming appt with OB 10/04/19 Schedule appt with GI asap.  Send test results indicating COVID test result on 09/12/19. Per discharge summary your tested positive on 09/17/2019 and need to complete 21days of quarantine.  Schedule lab appt after end of quarantine period (10/08/2019).  This information is directly available on the CDC website: RunningShows.co.za.html    Source:CDC Reference to specific commercial products, manufacturers, companies, or trademarks does not constitute its endorsement or recommendation by the [place], [place], or Centers for [employer] and Prevention.

## 2019-09-30 ENCOUNTER — Other Ambulatory Visit: Payer: Self-pay | Admitting: *Deleted

## 2019-09-30 NOTE — Patient Outreach (Signed)
[facility]) Care Management  09/30/2019  Julia Ibarra 11-05-1975 [id number]  Transition of care call Call attempt #2 Referral received: 09/20/19 Initial outreach attempt: 09/27/19 Insurance: [place]    2nd unsuccessful telephone call to patient's preferred contact number in order to complete post hospital discharge transition of care assessment , no answer left HIPAA compliant message requesting return call.    Objective: Per the electronic medical record, Julia Ibarra  was hospitalized at [facility]  11/27-12/4/20  For covid 19 pneumonitis with acute hypoxic respiratory failure, current pregnancy. Comorbidities include: Vitamin D deficiency, history of pregnancy .  She  was discharged to home on 09/24/19  without the need for home health services or durable medical equipment per the discharge summary.    Plan If no return call from patient will attempt 3rd outreach in the next 4 business days.    Joylene Draft, RN, [place] Management Coordinator  [phone]- Mobile [phone]- Toll Free Main Office

## 2019-10-05 ENCOUNTER — Encounter: Payer: Self-pay | Admitting: Nurse Practitioner

## 2019-10-05 ENCOUNTER — Other Ambulatory Visit: Payer: Self-pay | Admitting: *Deleted

## 2019-10-05 NOTE — Progress Notes (Signed)
Abstracted result and sent to scan  

## 2019-10-05 NOTE — Patient Outreach (Signed)
[facility]) Care Management  10/05/2019  TIESE CARLEN 1976/03/20 [id number]   Transition of care call  Referral received: 09/20/19 Initial outreach attempt: 09/27/19 Insurance:  [place] unsuccessful telephone call to patient's preferred contact number in order to complete post hospital discharge transition of care assessment; no answer, unable to leave a message as mailbox is full.   Objective: Per the electronic medical record, Unique Kaseman  was hospitalized at [facility]  11/27-12/4/20  For covid 19 pneumonitis with acute hypoxic respiratory failure, current pregnancy. Comorbidities include: Vitamin D deficiency, history of pregnancy .  She  was discharged to home on 09/24/19  without the need for home health services or durable medical equipment per the discharge summary  Plan: If no return call from patient, will close case to [place] Management services in 10 business days after initial post hospital discharge outreach,on 09/27/19.   Joylene Draft, RN, [place] Management Coordinator  [phone]- Mobile [phone]- Toll Free Main Office

## 2019-10-08 ENCOUNTER — Other Ambulatory Visit: Payer: Self-pay | Admitting: *Deleted

## 2019-10-08 NOTE — Patient Outreach (Signed)
[facility]) Care Management  10/08/2019  Julia Ibarra [birth year] [id number]   Transition of care/case closure /Unsuccessful outreach Unable to contact   Referral received:09/20/19 Initial outreach:09/27/19 Insurance: [place]   Objective: Per the electronic medical record,Julia Rattraywas hospitalized at [facility] 11/27-12/4/20 For covid 19 pneumonitis with acute hypoxic respiratory failure, current pregnancy. Comorbidities include:Vitamin D deficiency, history of pregnancy . She was discharged to home on 12/4/20without the need for home health services or durable medical equipment per the discharge summary Assessment  Unable to complete post hospital discharge transition of care assessment , No return call from patient after 3 attempts and no response to request to contact RN Care Coordinator in unsuccessful outreach letter mailed to home on 09/27/19.  Plan: Case Closed to [facility] care management services as it has been 10 days since initial post hospital discharge outreach.    Joylene Draft, RN, [place] Management Coordinator  [phone]- Mobile [phone]- Toll Free Main Office

## 2019-10-11 ENCOUNTER — Other Ambulatory Visit: Payer: 59

## 2019-10-12 ENCOUNTER — Other Ambulatory Visit (INDEPENDENT_AMBULATORY_CARE_PROVIDER_SITE_OTHER): Payer: 59

## 2019-10-12 ENCOUNTER — Other Ambulatory Visit: Payer: Self-pay

## 2019-10-12 DIAGNOSIS — U071 COVID-19: Secondary | ICD-10-CM

## 2019-10-12 DIAGNOSIS — Z8619 Personal history of other infectious and parasitic diseases: Secondary | ICD-10-CM

## 2019-10-12 DIAGNOSIS — R7989 Other specified abnormal findings of blood chemistry: Secondary | ICD-10-CM

## 2019-10-12 LAB — BASIC METABOLIC PANEL
BUN: 10 mg/dL (ref 6–23)
CO2: 20 mEq/L (ref 19–32)
Calcium: 9.5 mg/dL (ref 8.4–10.5)
Chloride: 107 mEq/L (ref 96–112)
Creatinine, Ser: 0.82 mg/dL (ref 0.40–1.20)
GFR: 92.78 mL/min (ref >60.00)
Glucose, Bld: 110 mg/dL — ABNORMAL HIGH (ref 70–99)
Potassium: 3.7 mEq/L (ref 3.5–5.1)
Sodium: 135 mEq/L (ref 135–145)

## 2019-10-12 LAB — HEPATIC FUNCTION PANEL
ALT: 24 U/L (ref 0–35)
AST: 17 U/L (ref 0–37)
Albumin: 3.4 g/dL — ABNORMAL LOW (ref 3.5–5.2)
Alkaline Phosphatase: 346 U/L — ABNORMAL HIGH (ref 39–117)
Bilirubin, Direct: 0.1 mg/dL (ref 0.0–0.3)
Total Bilirubin: 0.4 mg/dL (ref 0.2–1.2)
Total Protein: 6.3 g/dL (ref 6.0–8.3)

## 2019-10-12 LAB — CBC WITH DIFFERENTIAL/PLATELET
Basophils Absolute: 0.1 10*3/uL (ref 0.0–0.1)
Basophils Relative: 0.8 % (ref 0.0–3.0)
Eosinophils Absolute: 0.1 10*3/uL (ref 0.0–0.7)
Eosinophils Relative: 0.8 % (ref 0.0–5.0)
HCT: 36.1 % (ref 36.0–46.0)
Hemoglobin: 11.6 g/dL — ABNORMAL LOW (ref 12.0–15.0)
Lymphocytes Relative: 34.9 % (ref 12.0–46.0)
Lymphs Abs: 3.2 10*3/uL (ref 0.7–4.0)
MCHC: 32 g/dL (ref 30.0–36.0)
MCV: 84.9 fl (ref 78.0–100.0)
Monocytes Absolute: 0.9 10*3/uL (ref 0.1–1.0)
Monocytes Relative: 10.4 % (ref 3.0–12.0)
Neutro Abs: 4.9 10*3/uL (ref 1.4–7.7)
Neutrophils Relative %: 53.1 % (ref 43.0–77.0)
Platelets: 173 10*3/uL (ref 150.0–400.0)
RBC: 4.26 Mil/uL (ref 3.87–5.11)
RDW: 14.1 % (ref 11.5–15.5)
WBC: 9.2 10*3/uL (ref 4.0–10.5)

## 2019-10-13 ENCOUNTER — Encounter: Payer: Self-pay | Admitting: Internal Medicine

## 2019-10-19 ENCOUNTER — Encounter: Payer: Self-pay | Admitting: *Deleted

## 2019-10-21 ENCOUNTER — Encounter: Payer: Self-pay | Admitting: Nurse Practitioner

## 2019-10-21 DIAGNOSIS — R7989 Other specified abnormal findings of blood chemistry: Secondary | ICD-10-CM

## 2019-10-27 ENCOUNTER — Encounter: Payer: Self-pay | Admitting: Internal Medicine

## 2019-10-27 ENCOUNTER — Ambulatory Visit (INDEPENDENT_AMBULATORY_CARE_PROVIDER_SITE_OTHER): Payer: No Typology Code available for payment source | Admitting: Internal Medicine

## 2019-10-27 ENCOUNTER — Other Ambulatory Visit (INDEPENDENT_AMBULATORY_CARE_PROVIDER_SITE_OTHER): Payer: No Typology Code available for payment source

## 2019-10-27 VITALS — BP 110/80 | HR 60 | Temp 97.2°F | Ht 66.0 in | Wt 248.0 lb

## 2019-10-27 DIAGNOSIS — Z8616 Personal history of COVID-19: Secondary | ICD-10-CM

## 2019-10-27 DIAGNOSIS — R748 Abnormal levels of other serum enzymes: Secondary | ICD-10-CM

## 2019-10-27 LAB — HEPATIC FUNCTION PANEL
ALT: 10 U/L (ref 0–35)
AST: 14 U/L (ref 0–37)
Albumin: 3.4 g/dL — ABNORMAL LOW (ref 3.5–5.2)
Alkaline Phosphatase: 435 U/L — ABNORMAL HIGH (ref 39–117)
Bilirubin, Direct: 0.1 mg/dL (ref 0.0–0.3)
Total Bilirubin: 0.4 mg/dL (ref 0.2–1.2)
Total Protein: 6.6 g/dL (ref 6.0–8.3)

## 2019-10-27 LAB — PROTIME-INR
INR: 1 ratio (ref 0.8–1.0)
Prothrombin Time: 11.2 s (ref 9.6–13.1)

## 2019-10-27 NOTE — Progress Notes (Signed)
Patient ID: Julia Ibarra, female   DOB: 1976-09-28, 44 y.o.   MRN: [id number] HPI: Julia Ibarra is a 44 year old female with a history of GERD, vitamin D deficiency, recent COVID-52 requiring hospitalization who is [redacted] weeks pregnant who is seen in consultation at the request of Dr. Lorayne Ibarra to evaluate elevated alkaline phosphatase.  She is here alone today.  She reports that she developed cough and upper respiratory symptoms in late November and had to be admitted from November 20 7 December fourth 2020 for COVID-19.  She did require oxygen but not intubation.  During this time she had a poor appetite as well as nausea and vomiting.  She was treated with remdesivir x5 days.  She was noted to have elevation in serum transaminases and alkaline phosphatase during this time.  She denies abdominal pain.  Her pregnancy has been rather uneventful except for her Covid infection.  She has had some itching which she contributes to dry skin.  No jaundice.  No nausea or vomiting.  No recent diarrhea.  She has a scheduled cesarean section on 11/18/2019  No family history of liver disease.  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  She is married with 3 children.  No tobacco use.  No alcohol use.  Past Medical History:  Diagnosis Date  . Abnormal transaminases   . Acute respiratory failure with hypoxemia ([place])   . Anemia   . COVID-19 09/17/2019  . GERD (gastroesophageal reflux disease)   . Vitamin D deficiency 12/03/2017    Past Surgical History:  Procedure Laterality Date  . CESAREAN SECTION     2  . TONSILLECTOMY      Outpatient Medications Prior to Visit  Medication Sig Dispense Refill  . famotidine (PEPCID) 20 MG tablet Take 1 tablet (20 mg total) by mouth daily. 30 tablet 0  . Prenatal Vit-Fe Fumarate-FA (PRENATAL MULTIVITAMIN) TABS tablet Take 1 tablet by mouth daily at 12 noon.    . benzonatate (TESSALON) 100 MG capsule Take 1 capsule (100 mg total) by mouth every 4 (four)  hours as needed for cough. (Patient not taking: Reported on 09/29/2019) 20 capsule 0   No facility-administered medications prior to visit.    No Known Allergies  Family History  Problem Relation Age of Onset  . Depression Mother   . Anxiety disorder Mother   . Bipolar disorder Mother   . Alcoholism Mother   . Hypertension Maternal Grandmother   . Cancer Maternal Grandmother        cervical cancer    Social History   Tobacco Use  . Smoking status: Never Smoker  . Smokeless tobacco: Never Used  Substance Use Topics  . Alcohol use: Yes    Alcohol/week: 1.0 - 2.0 standard drinks    Types: 1 - 2 Glasses of wine per week    Comment: On the weekends  . Drug use: No    ROS: As per history of present illness, otherwise negative  BP 110/80   Pulse 60   Temp (!) 97.2 F (36.2 C)   Ht '5\' 6"'  (1.676 m)   Wt 248 lb (112.5 kg)   BMI 40.03 kg/m  Constitutional: Well-developed and well-nourished. No distress. HEENT: Normocephalic and atraumatic. Oropharynx is clear and moist. Conjunctivae are normal.  No scleral icterus. Neck: Neck supple. Trachea midline. Cardiovascular: Normal rate, regular rhythm and intact distal pulses. No M/R/G Pulmonary/chest: Effort normal and breath sounds normal. No wheezing, rales or rhonchi. Abdominal: Soft, nontender, nondistended.  Bowel sounds active throughout. There are no masses palpable. No hepatosplenomegaly. Extremities: no clubbing, cyanosis, or edema Neurological: Alert and oriented to person place and time. Skin: Skin is warm and dry.  Psychiatric: Normal mood and affect. Behavior is normal.  RELEVANT LABS AND IMAGING: CBC    Component Value Date/Time   WBC 9.2 10/12/2019 0858   RBC 4.26 10/12/2019 0858   HGB 11.6 (L) 10/12/2019 0858   HGB 11.7 12/03/2017 1009   HCT 36.1 10/12/2019 0858   HCT 37.3 12/03/2017 1009   PLT 173.0 10/12/2019 0858   MCV 84.9 10/12/2019 0858   MCV 90 12/03/2017 1009   MCH 27.5 09/22/2019 0401   MCHC  32.0 10/12/2019 0858   RDW 14.1 10/12/2019 0858   RDW 13.9 12/03/2017 1009   LYMPHSABS 3.2 10/12/2019 0858   LYMPHSABS 2.4 12/03/2017 1009   MONOABS 0.9 10/12/2019 0858   EOSABS 0.1 10/12/2019 0858   EOSABS 0.1 12/03/2017 1009   BASOSABS 0.1 10/12/2019 0858   BASOSABS 0.0 12/03/2017 1009    CMP     Component Value Date/Time   NA 135 10/12/2019 0858   NA 140 12/03/2017 1009   K 3.7 10/12/2019 0858   CL 107 10/12/2019 0858   CO2 20 10/12/2019 0858   GLUCOSE 110 (H) 10/12/2019 0858   BUN 10 10/12/2019 0858   BUN 10 12/03/2017 1009   CREATININE 0.82 10/12/2019 0858   CALCIUM 9.5 10/12/2019 0858   PROT 6.3 10/12/2019 0858   PROT 6.7 12/03/2017 1009   ALBUMIN 3.4 (L) 10/12/2019 0858   ALBUMIN 4.2 12/03/2017 1009   AST 17 10/12/2019 0858   ALT 24 10/12/2019 0858   ALKPHOS 346 (H) 10/12/2019 0858   BILITOT 0.4 10/12/2019 0858   BILITOT 0.3 12/03/2017 1009   GFRNONAA >60 09/23/2019 0508   GFRAA >60 09/23/2019 0508   Results for Julia Ibarra, Julia Ibarra (MRN [id number]) as of 10/27/2019 19:37  Ref. Range 09/17/2019 14:52 09/18/2019 05:55 09/19/2019 06:18 09/20/2019 05:53 09/21/2019 01:26 09/22/2019 04:01 09/23/2019 05:08 10/12/2019 08:58 10/27/2019 16:05  Alkaline Phosphatase Latest Ref Range: 39 - 117 U/L 183 (H) 141 (H) 152 (H) 159 (H) 168 (H) 183 (H) 196 (H) 346 (H) 435 (H)  Albumin Latest Ref Range: 3.5 - 5.2 g/dL 2.8 (L) 2.2 (L) 2.2 (L) 2.1 (L) 2.2 (L) 2.1 (L) 2.1 (L) 3.4 (L) 3.4 (L)  Lipase Latest Ref Range: 11 - 51 U/L 36          AST Latest Ref Range: 0 - 37 U/L 52 (H) 47 (H) 38 79 (H) 133 (H) 97 (H) 68 (H) 17 14  ALT Latest Ref Range: 0 - 35 U/L 47 (H) 38 38 64 (H) 113 (H) 104 (H) 90 (H) 24 10  Total Protein Latest Ref Range: 6.0 - 8.3 g/dL 6.7 5.1 (L) 5.4 (L) 5.2 (L) 5.1 (L) 5.2 (L) 5.0 (L) 6.3 6.6  Bilirubin, Direct Latest Ref Range: 0.0 - 0.3 mg/dL        0.1 0.1  Total Bilirubin Latest Ref Range: 0.2 - 1.2 mg/dL 1.2 1.6 (H) 1.4 (H) 0.7 0.9 0.7 0.4 0.4 0.4   Alk phos was normal in  2019  ASSESSMENT/PLAN: 44 year old female with a history of GERD, vitamin D deficiency, recent COVID-19 requiring hospitalization who is [redacted] weeks pregnant who is seen in consultation at the request of Dr. Lorayne Ibarra to evaluate elevated alkaline phosphatase.   1.  Elevated serum alkaline phosphatase --no evidence for chronic liver disease and her serum transaminases and elevated  alkaline phosphatase were very likely related to acute COVID-19 infection occurring in the late November and early December 2020.  Her alkaline phosphatase has not yet normalized though her serum AST, ALT and total bilirubin are normal.  This is reassuring.  I think this is post Covid and will likely improve.  I do not think this is pregnancy related.  Viral hepatitis studies negative.  I recommended the following --Liver ultrasound with Doppler --Repeat hepatic function panel today --Check INR today --Follow alkaline phosphatase over time would recommend about a month after pregnancy rechecking this to ensure improvement.   IT:GPQD, Charlene Brooke, [place],  [place] [id number]

## 2019-10-27 NOTE — Patient Instructions (Addendum)
If you are age 42 or older, your body mass index should be between 23-30. Your Body mass index is 40.03 kg/m. If this is out of the aforementioned range listed, please consider follow up with your Primary Care Provider.  If you are age 33 or younger, your body mass index should be between 19-25. Your Body mass index is 40.03 kg/m. If this is out of the aformentioned range listed, please consider follow up with your Primary Care Provider.   Your provider has requested that you go to the basement level for lab work before leaving today. Press "B" on the elevator. The lab is located at the first door on the left as you exit the elevator.  You have been scheduled for a liver ultrasound at [facility] (1st floor of hospital) on 11/03/19 at 8:30am. Please arrive 15 minutes prior to your appointment for registration. Make certain not to have anything to eat or drink after midnight on the night prior to your appointment. Should you need to reschedule your appointment, please contact radiology at [phone]. This test typically takes about 30 minutes to perform.  Due to recent changes in healthcare laws, you may see the results of your imaging and laboratory studies on MyChart before your provider has had a chance to review them.  We understand that in some cases there may be results that are confusing or concerning to you. Not all laboratory results come back in the same time frame and the provider may be waiting for multiple results in order to interpret others.  Please give [place] 48 hours in order for your provider to thoroughly review all the results before contacting the office for clarification of your results.

## 2019-10-28 ENCOUNTER — Other Ambulatory Visit: Payer: Self-pay

## 2019-10-28 DIAGNOSIS — R748 Abnormal levels of other serum enzymes: Secondary | ICD-10-CM

## 2019-10-28 LAB — OB RESULTS CONSOLE GBS: GBS: NEGATIVE

## 2019-11-02 ENCOUNTER — Encounter (HOSPITAL_COMMUNITY): Payer: Self-pay | Admitting: *Deleted

## 2019-11-02 ENCOUNTER — Encounter (HOSPITAL_COMMUNITY): Payer: Self-pay

## 2019-11-02 NOTE — Patient Instructions (Addendum)
Julia Ibarra  11/02/2019   Your procedure is scheduled on:  11/18/2019  Arrive at [facility] at [employer] [facility] on [address] at [facility]  and [address]. You are invited to use the FREE valet parking or use the Visitor's parking deck.  Pick up the phone at the desk and dial [phone].  Call this number if you have problems the morning of surgery: [phone]  Remember:   Do not eat food:(After Midnight) Desps de medianoche.  Do not drink clear liquids: (After Midnight) Desps de medianoche.  Take these medicines the morning of surgery with A SIP OF WATER:  None may take pepcid   Do not wear jewelry, make-up or nail polish.  Do not wear lotions, powders, or perfumes. Do not wear deodorant.  Do not shave 48 hours prior to surgery.  Do not bring valuables to the hospital.  [facility] is not   responsible for any belongings or valuables brought to the hospital.  Contacts, dentures or bridgework may not be worn into surgery.  Leave suitcase in the car. After surgery it may be brought to your room.  For patients admitted to the hospital, checkout time is 11:00 AM the day of              discharge.      Please read over the following fact sheets that you were given:     Preparing for Surgery

## 2019-11-03 ENCOUNTER — Other Ambulatory Visit: Payer: Self-pay

## 2019-11-03 ENCOUNTER — Ambulatory Visit (HOSPITAL_COMMUNITY)
Admission: RE | Admit: 2019-11-03 | Discharge: 2019-11-03 | Disposition: A | Discharge status: Home / Self Care | Payer: No Typology Code available for payment source | Source: Ambulatory Visit | Attending: Internal Medicine | Admitting: Internal Medicine

## 2019-11-03 DIAGNOSIS — R748 Abnormal levels of other serum enzymes: Secondary | ICD-10-CM | POA: Insufficient documentation

## 2019-11-04 ENCOUNTER — Other Ambulatory Visit: Payer: Self-pay

## 2019-11-04 DIAGNOSIS — R7989 Other specified abnormal findings of blood chemistry: Secondary | ICD-10-CM

## 2019-11-08 ENCOUNTER — Inpatient Hospital Stay (HOSPITAL_COMMUNITY)
Admission: AD | Admit: 2019-11-08 | Discharge: 2019-11-10 | DRG: 785 | Disposition: A | Discharge status: Home / Self Care | Payer: No Typology Code available for payment source | Attending: Obstetrics & Gynecology | Admitting: Obstetrics & Gynecology

## 2019-11-08 ENCOUNTER — Inpatient Hospital Stay (HOSPITAL_COMMUNITY)
Admission: RE | Admit: 2019-11-08 | Payer: No Typology Code available for payment source | Source: Home / Self Care | Admitting: Obstetrics & Gynecology

## 2019-11-08 ENCOUNTER — Other Ambulatory Visit: Payer: Self-pay

## 2019-11-08 ENCOUNTER — Encounter (HOSPITAL_COMMUNITY): Payer: Self-pay | Admitting: Obstetrics and Gynecology

## 2019-11-08 ENCOUNTER — Inpatient Hospital Stay (HOSPITAL_COMMUNITY): Payer: No Typology Code available for payment source | Admitting: Anesthesiology

## 2019-11-08 ENCOUNTER — Encounter (HOSPITAL_COMMUNITY)
Admission: AD | Disposition: A | Discharge status: Home / Self Care | Payer: Self-pay | Source: Home / Self Care | Attending: Obstetrics & Gynecology

## 2019-11-08 DIAGNOSIS — O134 Gestational [pregnancy-induced] hypertension without significant proteinuria, complicating childbirth: Secondary | ICD-10-CM | POA: Diagnosis present

## 2019-11-08 DIAGNOSIS — Z8616 Personal history of COVID-19: Secondary | ICD-10-CM | POA: Diagnosis present

## 2019-11-08 DIAGNOSIS — O34211 Maternal care for low transverse scar from previous cesarean delivery: Secondary | ICD-10-CM | POA: Diagnosis present

## 2019-11-08 DIAGNOSIS — Z302 Encounter for sterilization: Secondary | ICD-10-CM | POA: Diagnosis not present

## 2019-11-08 DIAGNOSIS — Z98891 History of uterine scar from previous surgery: Secondary | ICD-10-CM

## 2019-11-08 DIAGNOSIS — Z3493 Encounter for supervision of normal pregnancy, unspecified, third trimester: Secondary | ICD-10-CM

## 2019-11-08 DIAGNOSIS — Z3A38 38 weeks gestation of pregnancy: Secondary | ICD-10-CM

## 2019-11-08 DIAGNOSIS — Z8049 Family history of malignant neoplasm of other genital organs: Secondary | ICD-10-CM

## 2019-11-08 LAB — COMPREHENSIVE METABOLIC PANEL
ALT: 11 U/L (ref 0–44)
AST: 16 U/L (ref 15–41)
Albumin: 2.5 g/dL — ABNORMAL LOW (ref 3.5–5.0)
Alkaline Phosphatase: 479 U/L — ABNORMAL HIGH (ref 38–126)
Anion gap: 9 (ref 5–15)
BUN: 5 mg/dL — ABNORMAL LOW (ref 6–20)
CO2: 20 mmol/L — ABNORMAL LOW (ref 22–32)
Calcium: 9.7 mg/dL (ref 8.9–10.3)
Chloride: 107 mmol/L (ref 98–111)
Creatinine, Ser: 0.98 mg/dL (ref 0.44–1.00)
GFR calc Af Amer: >60 mL/min (ref >60)
GFR calc non Af Amer: >60 mL/min (ref >60)
Glucose, Bld: 78 mg/dL (ref 70–99)
Potassium: 4.3 mmol/L (ref 3.5–5.1)
Sodium: 136 mmol/L (ref 135–145)
Total Bilirubin: 0.7 mg/dL (ref 0.3–1.2)
Total Protein: 5.5 g/dL — ABNORMAL LOW (ref 6.5–8.1)

## 2019-11-08 LAB — CBC
HCT: 34.2 % — ABNORMAL LOW (ref 36.0–46.0)
Hemoglobin: 11.2 g/dL — ABNORMAL LOW (ref 12.0–15.0)
MCH: 27.5 pg (ref 26.0–34.0)
MCHC: 32.7 g/dL (ref 30.0–36.0)
MCV: 84 fL (ref 80.0–100.0)
Platelets: 230 10*3/uL (ref 150–400)
RBC: 4.07 MIL/uL (ref 3.87–5.11)
RDW: 14.3 % (ref 11.5–15.5)
WBC: 8.5 10*3/uL (ref 4.0–10.5)
nRBC: 0 % (ref 0.0–0.2)

## 2019-11-08 LAB — TYPE AND SCREEN
ABO/RH(D): O POS
Antibody Screen: NEGATIVE

## 2019-11-08 SURGERY — Surgical Case
Anesthesia: Spinal | Site: Abdomen | Wound class: Clean Contaminated

## 2019-11-08 MED ORDER — NALBUPHINE HCL 10 MG/ML IJ SOLN: 5.0000 mg | INTRAMUSCULAR | Status: DC | PRN

## 2019-11-08 MED ORDER — DIPHENHYDRAMINE HCL 25 MG PO CAPS: 25.0000 mg | ORAL_CAPSULE | ORAL | Status: DC | PRN

## 2019-11-08 MED ORDER — CEFAZOLIN SODIUM-DEXTROSE 2-4 GM/100ML-% IV SOLN
INTRAVENOUS | Status: AC
  Filled 2019-11-08: qty 100

## 2019-11-08 MED ORDER — COCONUT OIL OIL: 1.0000 "application " | TOPICAL_OIL | Status: DC | PRN

## 2019-11-08 MED ORDER — BUPIVACAINE IN DEXTROSE 0.75-8.25 % IT SOLN
INTRATHECAL | Status: DC | PRN
  Administered 2019-11-08: 1.5 mL via INTRATHECAL

## 2019-11-08 MED ORDER — FENTANYL CITRATE (PF) 100 MCG/2ML IJ SOLN: 25.0000 ug | INTRAMUSCULAR | Status: DC | PRN

## 2019-11-08 MED ORDER — PROMETHAZINE HCL 25 MG/ML IJ SOLN: 6.2500 mg | INTRAMUSCULAR | Status: DC | PRN

## 2019-11-08 MED ORDER — OXYTOCIN 40 UNITS IN NORMAL SALINE INFUSION - SIMPLE MED
INTRAVENOUS | Status: DC | PRN
  Administered 2019-11-08: 40 mL via INTRAVENOUS

## 2019-11-08 MED ORDER — ACETAMINOPHEN 500 MG PO TABS
1000.0000 mg | ORAL_TABLET | Freq: Four times a day (QID) | ORAL | Status: DC
  Administered 2019-11-08 – 2019-11-10 (×6): 1000 mg via ORAL
  Filled 2019-11-08 (×6): qty 2

## 2019-11-08 MED ORDER — MENTHOL 3 MG MT LOZG: 1.0000 | LOZENGE | OROMUCOSAL | Status: DC | PRN

## 2019-11-08 MED ORDER — ONDANSETRON HCL 4 MG/2ML IJ SOLN
INTRAMUSCULAR | Status: AC
  Filled 2019-11-08: qty 2

## 2019-11-08 MED ORDER — ZOLPIDEM TARTRATE 5 MG PO TABS: 5.0000 mg | ORAL_TABLET | Freq: Every evening | ORAL | Status: DC | PRN

## 2019-11-08 MED ORDER — SIMETHICONE 80 MG PO CHEW
80.0000 mg | CHEWABLE_TABLET | Freq: Three times a day (TID) | ORAL | Status: DC
  Administered 2019-11-09 (×3): 80 mg via ORAL
  Filled 2019-11-08 (×3): qty 1

## 2019-11-08 MED ORDER — ALBUMIN HUMAN 5 % IV SOLN
INTRAVENOUS | Status: AC
  Filled 2019-11-08: qty 250

## 2019-11-08 MED ORDER — KETOROLAC TROMETHAMINE 30 MG/ML IJ SOLN
INTRAMUSCULAR | Status: AC
  Filled 2019-11-08: qty 1

## 2019-11-08 MED ORDER — FAMOTIDINE 20 MG PO TABS
20.0000 mg | ORAL_TABLET | Freq: Every day | ORAL | Status: DC
  Administered 2019-11-09: 10:00:00 20 mg via ORAL
  Filled 2019-11-08: qty 1

## 2019-11-08 MED ORDER — ACETAMINOPHEN 160 MG/5ML PO SOLN: 325.0000 mg | Freq: Once | ORAL | Status: DC | PRN

## 2019-11-08 MED ORDER — PHENYLEPHRINE 40 MCG/ML (10ML) SYRINGE FOR IV PUSH (FOR BLOOD PRESSURE SUPPORT)
PREFILLED_SYRINGE | INTRAVENOUS | Status: AC
  Filled 2019-11-08: qty 10

## 2019-11-08 MED ORDER — NALBUPHINE HCL 10 MG/ML IJ SOLN: 5.0000 mg | Freq: Once | INTRAMUSCULAR | Status: DC | PRN

## 2019-11-08 MED ORDER — EPHEDRINE SULFATE 50 MG/ML IJ SOLN
INTRAMUSCULAR | Status: DC | PRN
  Administered 2019-11-08: 10 mg via INTRAVENOUS

## 2019-11-08 MED ORDER — IBUPROFEN 800 MG PO TABS
800.0000 mg | ORAL_TABLET | Freq: Four times a day (QID) | ORAL | Status: DC
  Administered 2019-11-09 – 2019-11-10 (×2): 800 mg via ORAL
  Filled 2019-11-08 (×2): qty 1

## 2019-11-08 MED ORDER — PRENATAL MULTIVITAMIN CH
1.0000 | ORAL_TABLET | Freq: Every day | ORAL | Status: DC
  Administered 2019-11-09: 1 via ORAL
  Filled 2019-11-08: qty 1

## 2019-11-08 MED ORDER — SIMETHICONE 80 MG PO CHEW: 80.0000 mg | CHEWABLE_TABLET | ORAL | Status: DC | PRN

## 2019-11-08 MED ORDER — SODIUM CHLORIDE 0.9 % IV SOLN: INTRAVENOUS | Status: DC | PRN

## 2019-11-08 MED ORDER — PHENYLEPHRINE HCL-NACL 20-0.9 MG/250ML-% IV SOLN
INTRAVENOUS | Status: DC | PRN
  Administered 2019-11-08: 60 ug/min via INTRAVENOUS

## 2019-11-08 MED ORDER — KETOROLAC TROMETHAMINE 30 MG/ML IJ SOLN
30.0000 mg | Freq: Four times a day (QID) | INTRAMUSCULAR | Status: AC
  Administered 2019-11-09 (×2): 30 mg via INTRAVENOUS
  Filled 2019-11-08 (×2): qty 1

## 2019-11-08 MED ORDER — SODIUM CHLORIDE 0.9% FLUSH: 3.0000 mL | INTRAVENOUS | Status: DC | PRN

## 2019-11-08 MED ORDER — LACTATED RINGERS IV SOLN: INTRAVENOUS | Status: DC

## 2019-11-08 MED ORDER — ONDANSETRON HCL 4 MG/2ML IJ SOLN: 4.0000 mg | Freq: Three times a day (TID) | INTRAMUSCULAR | Status: DC | PRN

## 2019-11-08 MED ORDER — WITCH HAZEL-GLYCERIN EX PADS: 1.0000 "application " | MEDICATED_PAD | CUTANEOUS | Status: DC | PRN

## 2019-11-08 MED ORDER — DEXAMETHASONE SODIUM PHOSPHATE 10 MG/ML IJ SOLN
INTRAMUSCULAR | Status: AC
  Filled 2019-11-08: qty 1

## 2019-11-08 MED ORDER — CEFAZOLIN SODIUM-DEXTROSE 2-4 GM/100ML-% IV SOLN: 2.0000 g | INTRAVENOUS | Status: DC

## 2019-11-08 MED ORDER — EPHEDRINE 5 MG/ML INJ
INTRAVENOUS | Status: AC
  Filled 2019-11-08: qty 10

## 2019-11-08 MED ORDER — DIPHENHYDRAMINE HCL 25 MG PO CAPS: 25.0000 mg | ORAL_CAPSULE | Freq: Four times a day (QID) | ORAL | Status: DC | PRN

## 2019-11-08 MED ORDER — FENTANYL CITRATE (PF) 100 MCG/2ML IJ SOLN
INTRAMUSCULAR | Status: AC
  Filled 2019-11-08: qty 2

## 2019-11-08 MED ORDER — DIPHENHYDRAMINE HCL 50 MG/ML IJ SOLN: 12.5000 mg | INTRAMUSCULAR | Status: DC | PRN

## 2019-11-08 MED ORDER — SOD CITRATE-CITRIC ACID 500-334 MG/5ML PO SOLN
ORAL | Status: AC
  Filled 2019-11-08: qty 30

## 2019-11-08 MED ORDER — ALBUMIN HUMAN 5 % IV SOLN: INTRAVENOUS | Status: DC | PRN

## 2019-11-08 MED ORDER — SENNOSIDES-DOCUSATE SODIUM 8.6-50 MG PO TABS
2.0000 | ORAL_TABLET | ORAL | Status: DC
  Administered 2019-11-08 – 2019-11-09 (×2): 2 via ORAL
  Filled 2019-11-08 (×2): qty 2

## 2019-11-08 MED ORDER — KETOROLAC TROMETHAMINE 30 MG/ML IJ SOLN: 30.0000 mg | Freq: Four times a day (QID) | INTRAMUSCULAR | Status: AC | PRN

## 2019-11-08 MED ORDER — FENTANYL CITRATE (PF) 100 MCG/2ML IJ SOLN
INTRAMUSCULAR | Status: DC | PRN
  Administered 2019-11-08: 15 ug via INTRATHECAL

## 2019-11-08 MED ORDER — DEXAMETHASONE SODIUM PHOSPHATE 10 MG/ML IJ SOLN
INTRAMUSCULAR | Status: DC | PRN
  Administered 2019-11-08: 10 mg via INTRAVENOUS

## 2019-11-08 MED ORDER — ONDANSETRON HCL 4 MG/2ML IJ SOLN
INTRAMUSCULAR | Status: DC | PRN
  Administered 2019-11-08: 4 mg via INTRAVENOUS

## 2019-11-08 MED ORDER — MORPHINE SULFATE (PF) 0.5 MG/ML IJ SOLN
INTRAMUSCULAR | Status: AC
  Filled 2019-11-08: qty 10

## 2019-11-08 MED ORDER — MEASLES, MUMPS & RUBELLA VAC IJ SOLR: 0.5000 mL | Freq: Once | INTRAMUSCULAR | Status: DC

## 2019-11-08 MED ORDER — CEFAZOLIN SODIUM-DEXTROSE 2-3 GM-%(50ML) IV SOLR
INTRAVENOUS | Status: DC | PRN
  Administered 2019-11-08: 2 g via INTRAVENOUS

## 2019-11-08 MED ORDER — TETANUS-DIPHTH-ACELL PERTUSSIS 5-2.5-18.5 LF-MCG/0.5 IM SUSP: 0.5000 mL | Freq: Once | INTRAMUSCULAR | Status: DC

## 2019-11-08 MED ORDER — OXYCODONE HCL 5 MG PO TABS: 5.0000 mg | ORAL_TABLET | ORAL | Status: DC | PRN

## 2019-11-08 MED ORDER — MORPHINE SULFATE (PF) 0.5 MG/ML IJ SOLN
INTRAMUSCULAR | Status: DC | PRN
  Administered 2019-11-08: .15 mg via INTRATHECAL

## 2019-11-08 MED ORDER — SODIUM CHLORIDE 0.9 % IR SOLN
Status: DC | PRN
  Administered 2019-11-08: 1

## 2019-11-08 MED ORDER — ACETAMINOPHEN 10 MG/ML IV SOLN: 1000.0000 mg | Freq: Once | INTRAVENOUS | Status: DC | PRN

## 2019-11-08 MED ORDER — DIBUCAINE (PERIANAL) 1 % EX OINT: 1.0000 "application " | TOPICAL_OINTMENT | CUTANEOUS | Status: DC | PRN

## 2019-11-08 MED ORDER — KETOROLAC TROMETHAMINE 30 MG/ML IJ SOLN
30.0000 mg | Freq: Four times a day (QID) | INTRAMUSCULAR | Status: AC | PRN
  Administered 2019-11-08: 30 mg via INTRAVENOUS

## 2019-11-08 MED ORDER — OXYTOCIN 40 UNITS IN NORMAL SALINE INFUSION - SIMPLE MED
INTRAVENOUS | Status: AC
  Filled 2019-11-08: qty 1000

## 2019-11-08 MED ORDER — MEPERIDINE HCL 25 MG/ML IJ SOLN: 6.2500 mg | INTRAMUSCULAR | Status: DC | PRN

## 2019-11-08 MED ORDER — OXYTOCIN 40 UNITS IN NORMAL SALINE INFUSION - SIMPLE MED: 2.5000 [IU]/h | INTRAVENOUS | Status: AC

## 2019-11-08 MED ORDER — HYDROMORPHONE HCL 1 MG/ML IJ SOLN: 0.2000 mg | INTRAMUSCULAR | Status: DC | PRN

## 2019-11-08 MED ORDER — NALOXONE HCL 0.4 MG/ML IJ SOLN: 0.4000 mg | INTRAMUSCULAR | Status: DC | PRN

## 2019-11-08 MED ORDER — SOD CITRATE-CITRIC ACID 500-334 MG/5ML PO SOLN
30.0000 mL | ORAL | Status: AC
  Administered 2019-11-08: 30 mL via ORAL

## 2019-11-08 MED ORDER — NALOXONE HCL 4 MG/10ML IJ SOLN
1.0000 ug/kg/h | INTRAVENOUS | Status: DC | PRN
  Filled 2019-11-08: qty 5

## 2019-11-08 MED ORDER — ACETAMINOPHEN 325 MG PO TABS: 325.0000 mg | ORAL_TABLET | Freq: Once | ORAL | Status: DC | PRN

## 2019-11-08 MED ORDER — PHENYLEPHRINE HCL (PRESSORS) 10 MG/ML IV SOLN
INTRAVENOUS | Status: DC | PRN
  Administered 2019-11-08 (×2): 80 ug via INTRAVENOUS
  Administered 2019-11-08: 40 ug via INTRAVENOUS

## 2019-11-08 MED ORDER — SCOPOLAMINE 1 MG/3DAYS TD PT72: 1.0000 | MEDICATED_PATCH | Freq: Once | TRANSDERMAL | Status: DC

## 2019-11-08 MED ORDER — PHENYLEPHRINE HCL-NACL 20-0.9 MG/250ML-% IV SOLN
INTRAVENOUS | Status: AC
  Filled 2019-11-08: qty 250

## 2019-11-08 MED ORDER — SIMETHICONE 80 MG PO CHEW
80.0000 mg | CHEWABLE_TABLET | ORAL | Status: DC
  Administered 2019-11-08 – 2019-11-09 (×2): 80 mg via ORAL
  Filled 2019-11-08 (×2): qty 1

## 2019-11-08 MED ORDER — LACTATED RINGERS IV SOLN: INTRAVENOUS | Status: DC | PRN

## 2019-11-08 SURGICAL SUPPLY — 38 items
BENZOIN TINCTURE PRP APPL 2/3 (GAUZE/BANDAGES/DRESSINGS) ×3 IMPLANT
CHLORAPREP W/TINT 26ML (MISCELLANEOUS) ×3 IMPLANT
CLAMP CORD UMBIL (MISCELLANEOUS) IMPLANT
CLOSURE STERI STRIP 1/2 X4 (GAUZE/BANDAGES/DRESSINGS) ×3 IMPLANT
CLOSURE WOUND 1/2 X4 (GAUZE/BANDAGES/DRESSINGS)
CLOTH BEACON ORANGE TIMEOUT ST (SAFETY) ×3 IMPLANT
DERMABOND ADVANCED (GAUZE/BANDAGES/DRESSINGS)
DERMABOND ADVANCED .7 DNX12 (GAUZE/BANDAGES/DRESSINGS) IMPLANT
DRSG OPSITE POSTOP 4X10 (GAUZE/BANDAGES/DRESSINGS) ×3 IMPLANT
ELECT REM PT RETURN 9FT ADLT (ELECTROSURGICAL) ×3
ELECTRODE REM PT RTRN 9FT ADLT (ELECTROSURGICAL) ×1 IMPLANT
EXTRACTOR VACUUM KIWI (MISCELLANEOUS) IMPLANT
GAUZE SPONGE 4X4 12PLY STRL LF (GAUZE/BANDAGES/DRESSINGS) ×6 IMPLANT
GLOVE BIO SURGEON STRL SZ 6 (GLOVE) ×3 IMPLANT
GLOVE BIOGEL PI IND STRL 6 (GLOVE) ×2 IMPLANT
GLOVE BIOGEL PI IND STRL 7.0 (GLOVE) ×1 IMPLANT
GLOVE BIOGEL PI INDICATOR 6 (GLOVE) ×4
GLOVE BIOGEL PI INDICATOR 7.0 (GLOVE) ×2
GOWN STRL REUS W/TWL LRG LVL3 (GOWN DISPOSABLE) ×6 IMPLANT
KIT ABG SYR 3ML LUER SLIP (SYRINGE) ×3 IMPLANT
NEEDLE HYPO 25X5/8 SAFETYGLIDE (NEEDLE) ×3 IMPLANT
NS IRRIG 1000ML POUR BTL (IV SOLUTION) ×3 IMPLANT
PACK C SECTION WH (CUSTOM PROCEDURE TRAY) ×3 IMPLANT
PAD ABD 7.5X8 STRL (GAUZE/BANDAGES/DRESSINGS) ×3 IMPLANT
PAD OB MATERNITY 4.3X12.25 (PERSONAL CARE ITEMS) ×3 IMPLANT
PENCIL SMOKE EVAC W/HOLSTER (ELECTROSURGICAL) ×3 IMPLANT
STRIP CLOSURE SKIN 1/2X4 (GAUZE/BANDAGES/DRESSINGS) IMPLANT
SUT CHROMIC 0 CTX 36 (SUTURE) ×9 IMPLANT
SUT MON AB 2-0 CT1 27 (SUTURE) ×3 IMPLANT
SUT PDS AB 0 CT1 27 (SUTURE) IMPLANT
SUT PLAIN 0 NONE (SUTURE) ×3 IMPLANT
SUT VIC AB 0 CT1 36 (SUTURE) IMPLANT
SUT VIC AB 0 CTX 36 (SUTURE) ×2
SUT VIC AB 0 CTX36XBRD ANBCTRL (SUTURE) ×1 IMPLANT
SUT VIC AB 4-0 KS 27 (SUTURE) IMPLANT
TOWEL OR 17X24 6PK STRL BLUE (TOWEL DISPOSABLE) ×3 IMPLANT
TRAY FOLEY W/BAG SLVR 14FR LF (SET/KITS/TRAYS/PACK) IMPLANT
WATER STERILE IRR 1000ML POUR (IV SOLUTION) ×3 IMPLANT

## 2019-11-08 NOTE — Lactation Note (Signed)
This note was copied from a baby's chart. Lactation Consultation Note Baby 5 hrs old Mom [employer] employee. Gave mom Employee pump sheet to choose from. Mom will let Lactation know what she chooses. Mom has 42 yr old twins that were in NICU, 1 for 1 wk. 1 for 3 wks. Mom pumped and bottle fed for 8 wks. Mom has 25 yr old that she BF for 6 wks. Mom will BF/formula feed this baby until she returns to work then she will formula feed. Newborn behavior, feeding habits, STS, I&O, breast massage, supply and demand discussed. LC changed baby's diaper.  Latched well in football hold. Baby BF well. No swallows heard at this time.  Mom has short shaft everted nipples that evert well w/stimulation. Mom itching bad from medication. Encouraged to call for assistance  If needed. Lactation brochure given.  Patient Name: Julia Ibarra [id number] Date: 11/08/2019 Reason for consult: Initial assessment;Early term 37-38.6wks   Maternal Data Has patient been taught Hand Expression?: Yes Does the patient have breastfeeding experience prior to this delivery?: Yes  Feeding Feeding Type: Breast Fed Nipple Type: Slow - flow  LATCH Score Latch: Grasps breast easily, tongue down, lips flanged, rhythmical sucking.  Audible Swallowing: None  Type of Nipple: Everted at rest and after stimulation  Comfort (Breast/Nipple): Soft / non-tender  Hold (Positioning): Assistance needed to correctly position infant at breast and maintain latch.  LATCH Score: 7  Interventions Interventions: Breast feeding basics reviewed;Support pillows;Assisted with latch;Position options;Skin to skin;Breast massage;Adjust position;Breast compression  Lactation Tools Discussed/Used WIC Program: No   Consult Status Consult Status: Follow-up Date: 11/09/19 Follow-up type: In-patient    Theodoro Kalata 11/08/2019, 10:34 PM

## 2019-11-08 NOTE — Op Note (Signed)
PROCEDURE DATE: 11/08/19  PREOPERATIVE DIAGNOSIS: Intrauterine pregnancy at  38.0 wga, Indication: repeat and desires permanent sterility in the setting of new onset gestational hypertension without severe features.    POSTOPERATIVE DIAGNOSIS: The same   PROCEDURE:    Repeat Low Transverse Cesarean Section with BTL   SURGEON:  Dr. Lucillie Garfinkel   INDICATIONS: This is a [id number] at 38.0 wga requiring cesarean section secondary to repeat and gHTN and desires permanent sterility. In office, Bps elevated. Labs reassuring. Given term, decision made to proceed with LTCS. The risks of cesarean section discussed with the patient included but were not limited to: bleeding which may require transfusion or reoperation; infection which may require antibiotics; injury to bowel, bladder, ureters or other surrounding organs; injury to the fetus; need for additional procedures including hysterectomy in the event of a life-threatening hemorrhage; placental abnormalities wth subsequent pregnancies, incisional problems, thromboembolic phenomenon and other postoperative/anesthesia complications. We also discussed bilateral tubal ligation in detail. The alternatives to permanent sterilization were reviewed and it was discussed that this is considered permanent. We reviewed the risks in detail including regret, failure and ectopic pregnancy.   The patient agreed with the proposed plan, giving informed consent for the procedure.     FINDINGS:  Viable female infant in vertex presentation, APGARs pending,  Weight pending, Amniotic fluid clear,  Intact placenta, edematous three vessel cord.  Grossly normal uterus, ovaries and fallopian tubes. .   ANESTHESIA:    Epidural ESTIMATED BLOOD LOSS: ~500cc, final pending SPECIMENS: Placenta for pathology COMPLICATIONS: Hypotension preop   PROCEDURE IN DETAIL:     The patient received intravenous antibiotics (2g Ancef) and had sequential compression devices applied to her lower  extremities while in the preoperative area.  She was then taken to the operating room where spinal anesthesia was dosed up to surgical level and was found to be adequate.She was then placed in a dorsal supine position with a leftward tilt,  BP dropped to 60s/30s and FHT in 50s. She required oxygen. While anesthesia team worked on increased her Bps, decision made to do splash prep.  A foley catheter was placed into her bladder and attached to constant gravity.  After an adequate timeout was performed, a Pfannenstiel skin incision was made with scalpel and carried through to the underlying layer of fascia. The fascia was incised in the midline and this incision was extended bilaterally using the Mayo scissors. Kocher clamps were applied to the superior aspect of the fascial incision and the underlying rectus muscles were dissected off bluntly. A similar process was carried out on the inferior aspect of the facial incision. The rectus muscles were separated in the midline bluntly and the peritoneum was entered bluntly.  A bladder flap was created sharply and developed bluntly. A transverse hysterotomy was made with a scalpel and extended bilaterally bluntly. The bladder blade was then removed. The infant was successfully delivered, and cord was clamped and cut and infant was handed over to awaiting neonatology team. Uterine massage was then administered and the placenta delivered intact with three-vessel cord. Cord gases were taken. The uterus was cleared of clot and debris.  The hysterotomy was closed with 0 vicryl.  A second imbricating suture of 0-vicryl was used to reinforce the incision and aid in hemostasis. A bilateral tubal ligation was performed via modified pomeroy in the usual fashion.  Good hemostasis was noted before and after uterus and fallopian tubes placed back into abdomen. The fascia was closed with 0-Vicryl in a  running fashion with good restoration of anatomy.  The subcutaneus tissue was  irrigated and was reapproximated using three interrupted plain gut stitches.  The skin was closed with 4-0 Vicryl in a subcuticular fashion.  Final EBL was pending but approximately 500cc visually (all surgical site and was hemostatic at end of procedure) without any further bleeding on exam.   It's a girl - "Magn[place]"!!   Pt tolerated the procedure. BP and oxygen improved. All sponge/lap/needle counts were correct  X 2. Pt taken to recovery room in stable condition.     Lucillie Garfinkel MD

## 2019-11-08 NOTE — Anesthesia Procedure Notes (Signed)
Spinal  Start time: 11/08/2019 5:07 PM End time: 11/08/2019 5:09 PM Staffing Performed: anesthesiologist  Anesthesiologist: Effie Berkshire, MD Preanesthetic Checklist Completed: patient identified, IV checked, site marked, risks and benefits discussed, surgical consent, monitors and equipment checked, pre-op evaluation and timeout performed Spinal Block Patient position: sitting Prep: DuraPrep and site prepped and draped Location: L3-4 Injection technique: single-shot Needle Needle type: Pencan  Needle gauge: 24 G Needle length: 10 cm Needle insertion depth: 10 cm Additional Notes Patient tolerated well. No immediate complications.

## 2019-11-08 NOTE — H&P (Addendum)
Julia Ibarra is a 44 y.o. female presenting for repeat cesarean section with bilateral tubal ligation s/s gestational HTN without severe features at term.  Seen in the office today for BP check after having elevated BP in 140s/90s on Friday. BP persistently elevated to 140s/90s meeting criteria for gHTN. She has no severe features. Labs sent Friday shows no evidence of severe disease.  Pregnancy c/b having COVID in 08/2019 from which she has recovered. Hx two prior CS with three living children.    OB History    Gravida  5   Para  3   Term  1   Preterm  2   AB  1   Living        SAB  1   TAB      Ectopic      Multiple  1   Live Births  3          Past Medical History:  Diagnosis Date  . Abnormal transaminases   . Acute respiratory failure with hypoxemia ([place])   . Anemia   . COVID-19 09/17/2019  . GERD (gastroesophageal reflux disease)   . Vitamin D deficiency 12/03/2017   Past Surgical History:  Procedure Laterality Date  . CESAREAN SECTION     2  . TONSILLECTOMY     Family History: family history includes Alcoholism in her mother; Anxiety disorder in her mother; Bipolar disorder in her mother; Cancer in her maternal grandmother; Depression in her mother; Hypertension in her maternal grandmother. Social History:  reports that she has never smoked. She has never used smokeless tobacco. She reports current alcohol use of about 1.0 - 2.0 standard drinks of alcohol per week. She reports that she does not use drugs.     Maternal Diabetes: No Genetic Screening: Normal Maternal Ultrasounds/Referrals: Normal Fetal Ultrasounds or other Referrals:  None Maternal Substance Abuse:  No Significant Maternal Medications:  None Significant Maternal Lab Results:  None Other Comments:  None  Review of Systems History   Last menstrual period 02/15/2019. Exam Physical Exam  (from office) NAD, A&O NWOB Abd soft, nondistended, gravid SVE deferred  Prenatal  labs: ABO, Rh: --/--/O POS Performed at [facility], [place] [address], [place], [place] [address]  [phone]/27 [id number]) Antibody: Negative (06/23 0000) Rubella:   RPR: Nonreactive (06/23 0000)  HBsAg: NON REACTIVE (12/03 1527)  HIV: NON REACTIVE (11/28 0555)  GBS: Negative/-- (01/07 0000)   Assessment/Plan: 44 yo presenting for scheduled repeat CS with requested bilateral tubal ligation. Risks discussed including infection, bleeding, damage to surrounding structures, the need for additional procedures including hysterectomy, and the possibility of uterine rupture with neonatal morbidity/mortality, scarring, and abnormal placentation with subsequent pregnancies. We also discussed bilateral tubal ligation in detail. The alternatives to permanent sterilization were reviewed and it was discussed that this is considered permanent. We reviewed the risks in detail including regret, failure and ectopic pregnancy. She understands and agrees to proceed. Defer Magnesium given no severe features. 2g ancef on call to OR.     Colin Benton Lenzi Marmo 11/08/2019, 1:09 PM

## 2019-11-08 NOTE — Progress Notes (Signed)
BP and oxygen saturation continue to improve Now 99-100% on RA BP control per anethesia in PACU

## 2019-11-08 NOTE — Anesthesia Postprocedure Evaluation (Signed)
Anesthesia Post Note  Patient: Shayana E Rattray  Procedure(s) Performed: CESAREAN SECTION (N/A Abdomen)     Patient location during evaluation: PACU Anesthesia Type: Spinal Level of consciousness: oriented and awake and alert Pain management: pain level controlled Vital Signs Assessment: post-procedure vital signs reviewed and stable Respiratory status: spontaneous breathing and respiratory function stable Cardiovascular status: blood pressure returned to baseline and stable Postop Assessment: no headache, no apparent nausea or vomiting and no backache Anesthetic complications: no    Last Vitals:  Vitals:   11/08/19 1815 11/08/19 1830  BP: (!) 81/53 (!) 89/63  Pulse: 74 70  Resp: 14 14  Temp:    SpO2: 100% 100%    Last Pain:  Vitals:   11/08/19 1301  TempSrc: Oral   Pain Goal:    LLE Motor Response: No movement due to regional block (11/08/19 1830)   RLE Motor Response: No movement due to regional block (11/08/19 1830)       Epidural/Spinal Function Cutaneous sensation: No Sensation (11/08/19 1830), Patient able to flex knees: No (11/08/19 1830), Patient able to lift hips off bed: No (11/08/19 1830), Back pain beyond tenderness at insertion site: No (11/08/19 1830), Progressively worsening motor and/or sensory loss: No (11/08/19 1830), Bowel and/or bladder incontinence post epidural: No (11/08/19 1830)  Pervis Hocking

## 2019-11-08 NOTE — Transfer of Care (Signed)
Immediate Anesthesia Transfer of Care Note  Patient: Julia Ibarra  Procedure(s) Performed: CESAREAN SECTION (N/A Abdomen)  Patient Location: PACU  Anesthesia Type:Spinal  Level of Consciousness: awake, alert , oriented and patient cooperative  Airway & Oxygen Therapy: Patient Spontanous Breathing and Patient connected to nasal cannula oxygen  Post-op Assessment: Report given to RN and Post -op Vital signs reviewed and stable  Post vital signs: Reviewed and stable  Last Vitals:  Vitals Value Taken Time  BP 89/63 11/08/19 1830  Temp 36.4 C 11/08/19 1812  Pulse 69 11/08/19 1835  Resp 17 11/08/19 1835  SpO2 100 % 11/08/19 1835  Vitals shown include unvalidated device data.  Last Pain:  Vitals:   11/08/19 1301  TempSrc: Oral         Complications: No apparent anesthesia complications

## 2019-11-08 NOTE — Anesthesia Preprocedure Evaluation (Signed)
Anesthesia Evaluation    Reviewed: Allergy & Precautions, Patient's Chart, lab work & pertinent test results  Airway Mallampati: I       Dental no notable dental hx.    Pulmonary    Pulmonary exam normal        Cardiovascular Normal cardiovascular exam     Neuro/Psych    GI/Hepatic Neg liver ROS, GERD  ,  Endo/Other  negative endocrine ROS  Renal/GU      Musculoskeletal   Abdominal   Peds  Hematology negative hematology ROS (+)   Anesthesia Other Findings   Reproductive/Obstetrics (+) Pregnancy                            Anesthesia Physical Anesthesia Plan  ASA: III  Anesthesia Plan: Spinal   Post-op Pain Management:    Induction:   PONV Risk Score and Plan: 3 and Ondansetron and Treatment may vary due to age or medical condition  Airway Management Planned: Natural Airway  Additional Equipment: None  Intra-op Plan:   Post-operative Plan:   Informed Consent: I have reviewed the patients History and Physical, chart, labs and discussed the procedure including the risks, benefits and alternatives for the proposed anesthesia with the patient or authorized representative who has indicated his/her understanding and acceptance.       Plan Discussed with: CRNA  Anesthesia Plan Comments: (Lab Results      Component                Value               Date                      WBC                      8.5                 11/08/2019                HGB                      11.2 (L)            11/08/2019                HCT                      34.2 (L)            11/08/2019                MCV                      84.0                11/08/2019                PLT                      230                 11/08/2019           )       Anesthesia Quick Evaluation

## 2019-11-09 LAB — CBC
HCT: 33.8 % — ABNORMAL LOW (ref 36.0–46.0)
Hemoglobin: 10.8 g/dL — ABNORMAL LOW (ref 12.0–15.0)
MCH: 27.1 pg (ref 26.0–34.0)
MCHC: 32 g/dL (ref 30.0–36.0)
MCV: 84.7 fL (ref 80.0–100.0)
Platelets: 221 10*3/uL (ref 150–400)
RBC: 3.99 MIL/uL (ref 3.87–5.11)
RDW: 14.4 % (ref 11.5–15.5)
WBC: 15.2 10*3/uL — ABNORMAL HIGH (ref 4.0–10.5)
nRBC: 0 % (ref 0.0–0.2)

## 2019-11-09 LAB — RPR: RPR Ser Ql: NONREACTIVE

## 2019-11-09 NOTE — Progress Notes (Signed)
Subjective: Postpartum Day 1: Cesarean Delivery Patient reports tolerating PO.    Objective: Vital signs in last 24 hours: Temp:  [97.5 F (36.4 C)-98.6 F (37 C)] 98.6 F (37 C) (01/19 0745) Pulse Rate:  [61-92] 61 (01/19 0745) Resp:  [14-24] 16 (01/19 0745) BP: (81-136)/(53-91) 100/63 (01/19 0745) SpO2:  [95 %-100 %] 97 % (01/19 0745) Weight:  [114.3 kg] 114.3 kg (01/18 1301)  Physical Exam:  General: alert, cooperative, appears stated age and no distress Lochia: appropriate Uterine Fundus: firm Incision: healing well DVT Evaluation: No evidence of DVT seen on physical exam.  Recent Labs    11/08/19 1308 11/09/19 0453  HGB 11.2* 10.8*  HCT 34.2* 33.8*    Assessment/Plan: Status post Cesarean section. Doing well postoperatively.  Continue current care Circ today.  Luz Lex 11/09/2019, 8:14 AM

## 2019-11-10 LAB — RUBELLA SCREEN: Rubella: 8.1 index (ref >0.99)

## 2019-11-10 NOTE — Lactation Note (Addendum)
This note was copied from a baby's chart. Lactation Consultation Note  Patient Name: Girl Iria Surprenant [id number] Date: 11/10/2019   P4, 75 hour female infant, -5% weight loss. LC entered room mom and infant asleep.  Maternal Data    Feeding Feeding Type: Breast Fed  LATCH Score Latch: Grasps breast easily, tongue down, lips flanged, rhythmical sucking.  Audible Swallowing: A few with stimulation  Type of Nipple: Everted at rest and after stimulation  Comfort (Breast/Nipple): Soft / non-tender  Hold (Positioning): No assistance needed to correctly position infant at breast.  LATCH Score: 9  Interventions    Lactation Tools Discussed/Used     Consult Status      Vicente Serene 11/10/2019, 3:46 AM

## 2019-11-10 NOTE — Lactation Note (Signed)
This note was copied from a baby's chart. Lactation Consultation Note  Patient Name: Julia Ibarra [id number] Date: 11/10/2019  P4, 42 hour, ETI  female infant. Mom with hx of  C/S delivery, AMA, Anemia and PIH. Per mom, infant is latching well at breast, infant last breastfeed at 4 am for 30 minutes. LC did not observe latch at this time. LC discussed hand expression and mom taught back , infant was given 4 mls of colostrum by spoon. Mom will continue to breastfeed according hunger cues, 8 to 12 times and on demand. Mom plans to hand express after latching infant at breast per feedings to give back any volume  EBM to help stabilize weight.  Mom knows to call RN or LC if she has any questions, concerns or need any assistance with latching infant at breast.  LC gave supplemental guideline sheet based on infant age, hours of life.  Maternal Data    Feeding Feeding Type: Breast Fed  LATCH Score                   Interventions    Lactation Tools Discussed/Used     Consult Status      Vicente Serene 11/10/2019, 5:51 AM

## 2019-11-10 NOTE — Lactation Note (Signed)
This note was copied from a baby's chart. Lactation Consultation Note  Patient Name: Julia Ibarra [id number] Date: 11/10/2019   Mom had been seen by lactation this morning. Per [name], RN, mom says she does not need another lactation visit prior to discharge.    Matthias Hughs [facility] 11/10/2019, 10:29 AM

## 2019-11-10 NOTE — Discharge Summary (Signed)
Obstetric Discharge Summary Reason for Admission: cesarean section Prenatal Procedures: none Intrapartum Procedures: cesarean: low cervical, transverse Postpartum Procedures: none Complications-Operative and Postpartum: none Hemoglobin  Date Value Ref Range Status  11/09/2019 10.8 (L) 12.0 - 15.0 g/dL Final  12/03/2017 11.7 11.1 - 15.9 g/dL Final   HCT  Date Value Ref Range Status  11/09/2019 33.8 (L) 36.0 - 46.0 % Final   Hematocrit  Date Value Ref Range Status  12/03/2017 37.3 34.0 - 46.6 % Final    Physical Exam:  General: alert, cooperative and appears stated age 44: appropriate Uterine Fundus: firm Incision: healing well, no significant drainage, no dehiscence, no significant erythema DVT Evaluation: No evidence of DVT seen on physical exam.  Discharge Diagnoses: Term Pregnancy-delivered  Discharge Information: Date: 11/10/2019 Activity: pelvic rest Diet: routine Medications: Ibuprofen Condition: stable Instructions: refer to practice specific booklet Discharge to: home   Newborn Data: Live born female  Birth Weight: 6 lb 15 oz (3147 g) APGAR: 8, 9  Newborn Delivery   Birth date/time: 11/08/2017 17:20:00 Delivery type: C-Section, Low Transverse Trial of labor: No C-section categorization: Repeat      Home with mother.  Julia Ibarra 11/10/2019, 8:42 AM

## 2019-11-11 LAB — SURGICAL PATHOLOGY

## 2019-11-16 ENCOUNTER — Other Ambulatory Visit (HOSPITAL_COMMUNITY)
Admission: RE | Admit: 2019-11-16 | Discharge: 2019-11-16 | Disposition: A | Discharge status: Home / Self Care | Payer: No Typology Code available for payment source | Source: Ambulatory Visit | Attending: Obstetrics & Gynecology | Admitting: Obstetrics & Gynecology

## 2020-05-31 ENCOUNTER — Other Ambulatory Visit: Payer: Self-pay

## 2020-05-31 ENCOUNTER — Telehealth: Payer: Self-pay

## 2020-05-31 DIAGNOSIS — R7989 Other specified abnormal findings of blood chemistry: Secondary | ICD-10-CM

## 2020-05-31 DIAGNOSIS — D1800 Hemangioma unspecified site: Secondary | ICD-10-CM

## 2020-05-31 NOTE — Telephone Encounter (Signed)
Pt scheduled for RUQ [place] at [facility] 06/07/20@10am . Pt to arrive at 9:45am and be NPO after midnight. Left message for pt to call back.

## 2020-05-31 NOTE — Telephone Encounter (Signed)
-----   Message from Algernon Huxley, RN sent at 05/10/2020  3:01 PM EDT ----- Regarding: FW: [place]  ----- Message ----- From: Algernon Huxley, RN Sent: 05/03/2020 To: Algernon Huxley, RN Subject: [place]                                             Pt needs repeat US of abd

## 2020-05-31 NOTE — Telephone Encounter (Signed)
Pt aware.

## 2020-06-07 ENCOUNTER — Ambulatory Visit (HOSPITAL_COMMUNITY)
Admission: RE | Admit: 2020-06-07 | Discharge: 2020-06-07 | Disposition: A | Discharge status: Home / Self Care | Payer: No Typology Code available for payment source | Source: Ambulatory Visit | Attending: Internal Medicine | Admitting: Internal Medicine

## 2020-06-07 ENCOUNTER — Other Ambulatory Visit: Payer: Self-pay

## 2020-06-07 DIAGNOSIS — D1800 Hemangioma unspecified site: Secondary | ICD-10-CM | POA: Diagnosis present

## 2020-06-07 DIAGNOSIS — R7989 Other specified abnormal findings of blood chemistry: Secondary | ICD-10-CM | POA: Diagnosis not present

## 2020-06-08 ENCOUNTER — Other Ambulatory Visit (INDEPENDENT_AMBULATORY_CARE_PROVIDER_SITE_OTHER): Payer: No Typology Code available for payment source

## 2020-06-08 ENCOUNTER — Other Ambulatory Visit: Payer: Self-pay

## 2020-06-08 DIAGNOSIS — D1800 Hemangioma unspecified site: Secondary | ICD-10-CM

## 2020-06-08 DIAGNOSIS — R7989 Other specified abnormal findings of blood chemistry: Secondary | ICD-10-CM

## 2020-06-08 LAB — HEPATIC FUNCTION PANEL
ALT: 9 U/L (ref 0–35)
AST: 11 U/L (ref 0–37)
Albumin: 4.1 g/dL (ref 3.5–5.2)
Alkaline Phosphatase: 80 U/L (ref 39–117)
Bilirubin, Direct: 0.1 mg/dL (ref 0.0–0.3)
Total Bilirubin: 0.5 mg/dL (ref 0.2–1.2)
Total Protein: 7.2 g/dL (ref 6.0–8.3)

## 2020-06-12 LAB — ALKALINE PHOSPHATASE, ISOENZYMES
Alkaline Phosphatase: 96 IU/L (ref 48–121)
BONE FRACTION: 31 % (ref 14–68)
INTESTINAL FRAC.: 0 % (ref 0–18)
LIVER FRACTION: 69 % (ref 18–85)

## 2020-06-14 ENCOUNTER — Ambulatory Visit (HOSPITAL_COMMUNITY)
Admission: RE | Admit: 2020-06-14 | Discharge: 2020-06-14 | Disposition: A | Discharge status: Home / Self Care | Payer: No Typology Code available for payment source | Source: Ambulatory Visit | Attending: Internal Medicine | Admitting: Internal Medicine

## 2020-06-14 ENCOUNTER — Other Ambulatory Visit: Payer: Self-pay

## 2020-06-14 DIAGNOSIS — R7989 Other specified abnormal findings of blood chemistry: Secondary | ICD-10-CM | POA: Insufficient documentation

## 2020-06-14 DIAGNOSIS — D1800 Hemangioma unspecified site: Secondary | ICD-10-CM | POA: Insufficient documentation

## 2020-06-14 LAB — NUCLEOTIDASE, 5', BLOOD: 5-Nucleotidase: 4 U/L (ref 0–10)

## 2020-06-14 MED ORDER — GADOBUTROL 1 MMOL/ML IV SOLN
10.0000 mL | Freq: Once | INTRAVENOUS | Status: AC | PRN
  Administered 2020-06-14: 10 mL via INTRAVENOUS

## 2020-07-03 ENCOUNTER — Encounter: Payer: Self-pay | Admitting: Nurse Practitioner

## 2020-07-03 ENCOUNTER — Ambulatory Visit (INDEPENDENT_AMBULATORY_CARE_PROVIDER_SITE_OTHER): Payer: No Typology Code available for payment source | Admitting: Nurse Practitioner

## 2020-07-03 ENCOUNTER — Other Ambulatory Visit: Payer: Self-pay

## 2020-07-03 VITALS — BP 136/84 | HR 82 | Temp 97.9°F | Ht 65.0 in | Wt 249.0 lb

## 2020-07-03 DIAGNOSIS — Z23 Encounter for immunization: Secondary | ICD-10-CM

## 2020-07-03 DIAGNOSIS — Z136 Encounter for screening for cardiovascular disorders: Secondary | ICD-10-CM

## 2020-07-03 DIAGNOSIS — Z13220 Encounter for screening for lipoid disorders: Secondary | ICD-10-CM | POA: Diagnosis not present

## 2020-07-03 DIAGNOSIS — Z0000 Encounter for general adult medical examination without abnormal findings: Secondary | ICD-10-CM

## 2020-07-03 NOTE — Progress Notes (Signed)
Subjective:    Patient ID: Julia Ibarra, female    DOB: 12-06-1975, 44 y.o.   MRN: [id number]  Patient presents today for CPE   HPI  Sexual History (orientation,birth control, marital status, STD):will schedule appt with GYN  Depression/Suicide: Depression screen [facility] 2/9 07/03/2020 03/05/2018 12/03/2017  Decreased Interest 0 0 1  Down, Depressed, Hopeless 0 0 1  PHQ - 2 Score 0 0 2  Altered sleeping - - 0  Tired, decreased energy - - 3  Change in appetite - - 1  Feeling bad or failure about yourself  - - 1  Trouble concentrating - - 0  Moving slowly or fidgety/restless - - 0  Suicidal thoughts - - 0  PHQ-9 Score - - 7  Difficult doing work/chores - - Not difficult at all   Vision:up to date  Dental:up to date  Immunizations: (TDAP, Hep C screen, Pneumovax, Influenza, zoster)  Health Maintenance  Topic Date Due  . Pap Smear  03/21/2020  . Tetanus Vaccine  08/24/2029  . Flu Shot  Completed  . COVID-19 Vaccine  Completed  .  Hepatitis C: One time screening is recommended by Center for Disease Control  (CDC) for  adults born from [birth year] through 1963.   Completed  . HIV Screening  Completed   Diet:regular.  Weight:  Wt Readings from Last 3 Encounters:  07/03/20 249 lb (112.9 kg)  11/02/19 250 lb (113.4 kg)  11/08/19 252 lb (114.3 kg)   Fall Risk: Fall Risk  03/05/2018  Falls in the past year? No   Medications and allergies reviewed with patient and updated if appropriate.  Patient Active Problem List   Diagnosis Date Noted  . Family history of cervical cancer 11/08/2019  . Other fatigue 12/03/2017  . Myopia 03/22/2014    Current Outpatient Medications on File Prior to Visit  Medication Sig Dispense Refill  . acetaminophen (TYLENOL) 500 MG tablet Take 1,000 mg by mouth every 6 (six) hours as needed for mild pain or headache.     No current facility-administered medications on file prior to visit.    Past Medical History:  Diagnosis Date  . Abnormal  transaminases   . Acute respiratory failure with hypoxemia ([place])   . Anemia   . COVID-19 09/17/2019  . GERD (gastroesophageal reflux disease)   . Vitamin D deficiency 12/03/2017    Past Surgical History:  Procedure Laterality Date  . CESAREAN SECTION     2  . CESAREAN SECTION N/A 11/08/2019   Procedure: CESAREAN SECTION;  Surgeon: Tyson Dense, MD;  Location: [facility] LD ORS;  Service: Obstetrics;  Laterality: N/A;  Repeat edc 11/22/19 NKDA  . TONSILLECTOMY      Social History   Socioeconomic History  . Marital status: Married    Spouse name: Not on file  . Number of children: 3  . Years of education: Not on file  . Highest education level: Not on file  Occupational History  . Occupation: Journalist, newspaper: [employer]  Tobacco Use  . Smoking status: Never Smoker  . Smokeless tobacco: Never Used  Vaping Use  . Vaping Use: Never used  Substance and Sexual Activity  . Alcohol use: Yes    Alcohol/week: 1.0 - 2.0 standard drink    Types: 1 - 2 Glasses of wine per week    Comment: On the weekends  . Drug use: No  . Sexual activity: Yes    Partners: Male  Birth control/protection: None  Other Topics Concern  . Not on file  Social History Narrative  . Not on file   Social Determinants of Health   Financial Resource Strain:   . Difficulty of Paying Living Expenses: Not on file  Food Insecurity:   . Worried About Charity fundraiser in the Last Year: Not on file  . Ran Out of Food in the Last Year: Not on file  Transportation Needs:   . Lack of Transportation (Medical): Not on file  . Lack of Transportation (Non-Medical): Not on file  Physical Activity:   . Days of Exercise per Week: Not on file  . Minutes of Exercise per Session: Not on file  Stress:   . Feeling of Stress : Not on file  Social Connections:   . Frequency of Communication with Friends and Family: Not on file  . Frequency of Social Gatherings with Friends and Family:  Not on file  . Attends Religious Services: Not on file  . Active Member of Clubs or Organizations: Not on file  . Attends Archivist Meetings: Not on file  . Marital Status: Not on file    Family History  Problem Relation Age of Onset  . Depression Mother   . Anxiety disorder Mother   . Bipolar disorder Mother   . Alcoholism Mother   . Hypertension Maternal Grandmother   . Cancer Maternal Grandmother        cervical cancer        Review of Systems  Constitutional: Negative for fever, malaise/fatigue and weight loss.  HENT: Negative for congestion and sore throat.   Eyes:       Negative for visual changes  Respiratory: Negative for cough and shortness of breath.   Cardiovascular: Negative for chest pain, palpitations and leg swelling.  Gastrointestinal: Negative for blood in stool, constipation, diarrhea and heartburn.  Genitourinary: Negative for dysuria, frequency and urgency.  Musculoskeletal: Negative for falls, joint pain and myalgias.  Skin: Negative for rash.  Neurological: Negative for dizziness, sensory change and headaches.  Endo/Heme/Allergies: Does not bruise/bleed easily.  Psychiatric/Behavioral: Negative for depression, substance abuse and suicidal ideas. The patient is not nervous/anxious.     Objective:   Vitals:   07/03/20 1330  BP: 136/84  Pulse: 82  Temp: 97.9 F (36.6 C)  SpO2: 97%   Body mass index is 41.44 kg/m.  Physical Examination:  Physical Exam Vitals reviewed.  Constitutional:      Appearance: She is well-developed. She is obese.  HENT:     Right Ear: Tympanic membrane, ear canal and external ear normal.     Left Ear: Tympanic membrane, ear canal and external ear normal.  Eyes:     Extraocular Movements: Extraocular movements intact.     Conjunctiva/sclera: Conjunctivae normal.  Cardiovascular:     Rate and Rhythm: Normal rate and regular rhythm.     Pulses: Normal pulses.     Heart sounds: Normal heart sounds.    Pulmonary:     Effort: Pulmonary effort is normal. No respiratory distress.     Breath sounds: Normal breath sounds.  Chest:     Chest wall: No tenderness.  Abdominal:     General: Bowel sounds are normal.     Palpations: Abdomen is soft.  Genitourinary:    Comments: Deferred breast and pelvic exam to GYN Musculoskeletal:        General: Normal range of motion.     Cervical back: Normal range of motion  and neck supple.  Lymphadenopathy:     Cervical: No cervical adenopathy.  Skin:    General: Skin is warm and dry.  Neurological:     Mental Status: She is alert and oriented to person, place, and time.     Deep Tendon Reflexes: Reflexes are normal and symmetric.  Psychiatric:        Mood and Affect: Mood normal.        Behavior: Behavior normal.        Thought Content: Thought content normal.     ASSESSMENT and PLAN: This visit occurred during the SARS-CoV-2 public health emergency.  Safety protocols were in place, including screening questions prior to the visit, additional usage of staff PPE, and extensive cleaning of exam room while observing appropriate contact time as indicated for disinfecting solutions.   Julia Ibarra was seen today for annual exam.  Diagnoses and all orders for this visit:  Preventative health care -     CBC with Differential/Platelet; Future -     TSH; Future -     Basic metabolic panel; Future -     Lipid panel; Future  Encounter for lipid screening for cardiovascular disease -     Lipid panel; Future  Influenza vaccine needed -     Flu Vaccine QUAD 6+ mos PF IM (Fluarix Quad PF)      Problem List Items Addressed This Visit    None    Visit Diagnoses    Preventative health care    -  Primary   Relevant Orders   CBC with Differential/Platelet   TSH   Basic metabolic panel   Lipid panel   Encounter for lipid screening for cardiovascular disease       Relevant Orders   Lipid panel   Influenza vaccine needed       Relevant Orders   Flu  Vaccine QUAD 6+ mos PF IM (Fluarix Quad PF) (Completed)      Follow up: Return in about 1 year (around 07/03/2021) for CPE (fasting).  Wilfred Lacy, NP

## 2020-07-03 NOTE — Patient Instructions (Addendum)
Need to be fasting 6-8hrs prior to blood draw.  Maintain Heart healthy diet and regular exercise.  Have update PAP results faxed to me once completed.   Health Maintenance, Female Adopting a healthy lifestyle and getting preventive care are important in promoting health and wellness. Ask your health care provider about:  The right schedule for you to have regular tests and exams.  Things you can do on your own to prevent diseases and keep yourself healthy. What should I know about diet, weight, and exercise? Eat a healthy diet   Eat a diet that includes plenty of vegetables, fruits, low-fat dairy products, and lean protein.  Do not eat a lot of foods that are high in solid fats, added sugars, or sodium. Maintain a healthy weight Body mass index (BMI) is used to identify weight problems. It estimates body fat based on height and weight. Your health care provider can help determine your BMI and help you achieve or maintain a healthy weight. Get regular exercise Get regular exercise. This is one of the most important things you can do for your health. Most adults should:  Exercise for at least 150 minutes each week. The exercise should increase your heart rate and make you sweat (moderate-intensity exercise).  Do strengthening exercises at least twice a week. This is in addition to the moderate-intensity exercise.  Spend less time sitting. Even light physical activity can be beneficial. Watch cholesterol and blood lipids Have your blood tested for lipids and cholesterol at 42 years of age, then have this test every 5 years. Have your cholesterol levels checked more often if:  Your lipid or cholesterol levels are high.  You are older than 42 years of age.  You are at high risk for heart disease. What should I know about cancer screening? Depending on your health history and family history, you may need to have cancer screening at various ages. This may include screening  for:  Breast cancer.  Cervical cancer.  Colorectal cancer.  Skin cancer.  Lung cancer. What should I know about heart disease, diabetes, and high blood pressure? Blood pressure and heart disease  High blood pressure causes heart disease and increases the risk of stroke. This is more likely to develop in people who have high blood pressure readings, are of African descent, or are overweight.  Have your blood pressure checked: ? Every 3-5 years if you are 70-76 years of age. ? Every year if you are 63 years old or older. Diabetes Have regular diabetes screenings. This checks your fasting blood sugar level. Have the screening done:  Once every three years after age 46 if you are at a normal weight and have a low risk for diabetes.  More often and at a younger age if you are overweight or have a high risk for diabetes. What should I know about preventing infection? Hepatitis B If you have a higher risk for hepatitis B, you should be screened for this virus. Talk with your health care provider to find out if you are at risk for hepatitis B infection. Hepatitis C Testing is recommended for:  Everyone born from [birth year] through 1963.  Anyone with known risk factors for hepatitis C. Sexually transmitted infections (STIs)  Get screened for STIs, including gonorrhea and chlamydia, if: ? You are sexually active and are younger than 42 years of age. ? You are older than 42 years of age and your health care provider tells you that you are at risk for this type  of infection. ? Your sexual activity has changed since you were last screened, and you are at increased risk for chlamydia or gonorrhea. Ask your health care provider if you are at risk.  Ask your health care provider about whether you are at high risk for HIV. Your health care provider may recommend a prescription medicine to help prevent HIV infection. If you choose to take medicine to prevent HIV, you should first get tested for HIV.  You should then be tested every 3 months for as long as you are taking the medicine. Pregnancy  If you are about to stop having your period (premenopausal) and you may become pregnant, seek counseling before you get pregnant.  Take 400 to 800 micrograms (mcg) of folic acid every day if you become pregnant.  Ask for birth control (contraception) if you want to prevent pregnancy. Osteoporosis and menopause Osteoporosis is a disease in which the bones lose minerals and strength with aging. This can result in bone fractures. If you are 27 years old or older, or if you are at risk for osteoporosis and fractures, ask your health care provider if you should:  Be screened for bone loss.  Take a calcium or vitamin D supplement to lower your risk of fractures.  Be given hormone replacement therapy (HRT) to treat symptoms of menopause. Follow these instructions at home: Lifestyle  Do not use any products that contain nicotine or tobacco, such as cigarettes, e-cigarettes, and chewing tobacco. If you need help quitting, ask your health care provider.  Do not use street drugs.  Do not share needles.  Ask your health care provider for help if you need support or information about quitting drugs. Alcohol use  Do not drink alcohol if: ? Your health care provider tells you not to drink. ? You are pregnant, may be pregnant, or are planning to become pregnant.  If you drink alcohol: ? Limit how much you use to 0-1 drink a day. ? Limit intake if you are breastfeeding.  Be aware of how much alcohol is in your drink. In the [place] one drink equals one 12 oz bottle of beer (355 mL), one 5 oz glass of wine (148 mL), or one 1 oz glass of hard liquor (44 mL). General instructions  Schedule regular health, dental, and eye exams.  Stay current with your vaccines.  Tell your health care provider if: ? You often feel depressed. ? You have ever been abused or do not feel safe at  home. Summary  Adopting a healthy lifestyle and getting preventive care are important in promoting health and wellness.  Follow your health care provider's instructions about healthy diet, exercising, and getting tested or screened for diseases.  Follow your health care provider's instructions on monitoring your cholesterol and blood pressure. This information is not intended to replace advice given to you by your health care provider. Make sure you discuss any questions you have with your health care provider. Document Revised: 09/30/2018 Document Reviewed: 09/30/2018 Elsevier Patient Education  2020 [employer].

## 2020-07-04 ENCOUNTER — Other Ambulatory Visit: Payer: No Typology Code available for payment source

## 2020-07-04 DIAGNOSIS — Z13220 Encounter for screening for lipoid disorders: Secondary | ICD-10-CM

## 2020-07-04 DIAGNOSIS — Z0000 Encounter for general adult medical examination without abnormal findings: Secondary | ICD-10-CM

## 2020-07-04 LAB — BASIC METABOLIC PANEL
BUN: 14 mg/dL (ref 6–23)
CO2: 26 mEq/L (ref 19–32)
Calcium: 9.2 mg/dL (ref 8.4–10.5)
Chloride: 108 mEq/L (ref 96–112)
Creatinine, Ser: 0.83 mg/dL (ref 0.40–1.20)
GFR: 91.17 mL/min (ref >60.00)
Glucose, Bld: 92 mg/dL (ref 70–99)
Potassium: 3.8 mEq/L (ref 3.5–5.1)
Sodium: 140 mEq/L (ref 135–145)

## 2020-07-04 LAB — CBC WITH DIFFERENTIAL/PLATELET
Basophils Absolute: 0 10*3/uL (ref 0.0–0.1)
Basophils Relative: 0.5 % (ref 0.0–3.0)
Eosinophils Absolute: 0.1 10*3/uL (ref 0.0–0.7)
Eosinophils Relative: 1 % (ref 0.0–5.0)
HCT: 37.7 % (ref 36.0–46.0)
Hemoglobin: 12 g/dL (ref 12.0–15.0)
Lymphocytes Relative: 30.5 % (ref 12.0–46.0)
Lymphs Abs: 2.1 10*3/uL (ref 0.7–4.0)
MCHC: 31.8 g/dL (ref 30.0–36.0)
MCV: 84.5 fl (ref 78.0–100.0)
Monocytes Absolute: 0.7 10*3/uL (ref 0.1–1.0)
Monocytes Relative: 10.4 % (ref 3.0–12.0)
Neutro Abs: 4 10*3/uL (ref 1.4–7.7)
Neutrophils Relative %: 57.6 % (ref 43.0–77.0)
Platelets: 282 10*3/uL (ref 150.0–400.0)
RBC: 4.46 Mil/uL (ref 3.87–5.11)
RDW: 15.9 % — ABNORMAL HIGH (ref 11.5–15.5)
WBC: 6.9 10*3/uL (ref 4.0–10.5)

## 2020-07-04 LAB — LIPID PANEL
Cholesterol: 175 mg/dL (ref 0–200)
HDL: 88.6 mg/dL (ref >39.00)
LDL Cholesterol: 75 mg/dL (ref 0–99)
NonHDL: 86.49
Total CHOL/HDL Ratio: 2
Triglycerides: 59 mg/dL (ref 0.0–149.0)
VLDL: 11.8 mg/dL (ref 0.0–40.0)

## 2020-07-04 LAB — TSH: TSH: 1.85 u[IU]/mL (ref 0.35–4.50)

## 2020-09-12 ENCOUNTER — Encounter: Payer: Self-pay | Admitting: Nurse Practitioner

## 2020-09-21 ENCOUNTER — Ambulatory Visit (INDEPENDENT_AMBULATORY_CARE_PROVIDER_SITE_OTHER): Payer: No Typology Code available for payment source | Admitting: Nurse Practitioner

## 2020-09-21 ENCOUNTER — Encounter: Payer: Self-pay | Admitting: Nurse Practitioner

## 2020-09-21 ENCOUNTER — Other Ambulatory Visit: Payer: Self-pay

## 2020-09-21 VITALS — BP 124/82 | HR 77 | Temp 96.9°F | Ht 66.0 in | Wt 248.0 lb

## 2020-09-21 DIAGNOSIS — M778 Other enthesopathies, not elsewhere classified: Secondary | ICD-10-CM | POA: Diagnosis not present

## 2020-09-21 DIAGNOSIS — G8929 Other chronic pain: Secondary | ICD-10-CM | POA: Diagnosis not present

## 2020-09-21 DIAGNOSIS — M25571 Pain in right ankle and joints of right foot: Secondary | ICD-10-CM | POA: Diagnosis not present

## 2020-09-21 DIAGNOSIS — F4323 Adjustment disorder with mixed anxiety and depressed mood: Secondary | ICD-10-CM

## 2020-09-21 NOTE — Progress Notes (Signed)
Subjective:  Patient ID: Julia Ibarra, female    DOB: [birth year]  Age: 44 y.o. MRN: [id number]  CC: Acute Visit (Pt c/o left hand pain and right foot pain x6 months. Pt states it makes it difficult when typing. Pt states the pain comes after working or standing for long periods of time. )  Ankle Pain  Incident onset: 2months ago. The injury mechanism is unknown. The pain is present in the right ankle. The quality of the pain is described as aching. The pain has been intermittent since onset. Pertinent negatives include no inability to bear weight, loss of motion, loss of sensation, muscle weakness, numbness or tingling. She reports no foreign bodies present. The symptoms are aggravated by movement, palpation and weight bearing. She has tried nothing for the symptoms.  Wrist Pain  The pain is present in the left fingers and left wrist. This is a chronic problem. The current episode started more than 1 month ago. There has been no history of extremity trauma. The problem occurs intermittently. The problem has been waxing and waning. The quality of the pain is described as aching. Pertinent negatives include no fever, inability to bear weight, itching, joint locking, joint swelling, limited range of motion, numbness, stiffness or tingling. The symptoms are aggravated by activity (typing on keyboard). She has tried rest for the symptoms. The treatment provided significant relief. Family history does not include gout or rheumatoid arthritis. There is no history of diabetes, gout, osteoarthritis or rheumatoid arthritis.   Reviewed past Medical, Social and Family history today.  Outpatient Medications Prior to Visit  Medication Sig Dispense Refill  . acetaminophen (TYLENOL) 500 MG tablet Take 1,000 mg by mouth every 6 (six) hours as needed for mild pain or headache.     No facility-administered medications prior to visit.    ROS See HPI  Objective:  BP 124/82 (BP Location: Left Arm, Patient  Position: Sitting, Cuff Size: Large)   Pulse 77   Temp (!) 96.9 F (36.1 C) (Temporal)   Ht 5\' 6"  (1.676 m)   Wt 248 lb (112.5 kg)   SpO2 99%   BMI 40.03 kg/m   Physical Exam Musculoskeletal:        General: Tenderness present. No swelling.     Right wrist: Normal.     Left wrist: Tenderness present. No swelling, effusion, bony tenderness, snuff box tenderness or crepitus. Normal range of motion. Normal pulse.     Right hand: Normal.     Left hand: Tenderness present. No swelling or bony tenderness. Normal range of motion. Normal strength. Normal sensation. Normal capillary refill. Normal pulse.     Right lower leg: Normal. No edema.     Left lower leg: Normal. No edema.     Right ankle: No swelling, deformity or lacerations. Tenderness present over the ATF ligament and CF ligament. No lateral malleolus, medial malleolus, posterior TF ligament, base of 5th metatarsal or proximal fibula tenderness. Normal range of motion. Anterior drawer test negative. Normal pulse.     Right Achilles Tendon: Normal.     Left ankle: Normal.     Left Achilles Tendon: Normal.     Right foot: Normal.     Left foot: Normal.  Neurological:     Mental Status: She is alert and oriented to person, place, and time.    Assessment & Plan:  This visit occurred during the SARS-CoV-2 public health emergency.  Safety protocols were in place, including screening questions prior to the visit,  additional usage of staff PPE, and extensive cleaning of exam room while observing appropriate contact time as indicated for disinfecting solutions.   Julia Ibarra was seen today for acute visit.  Diagnoses and all orders for this visit:  Chronic pain of right ankle  Left wrist tendonitis  Adjustment disorder with mixed anxiety and depressed mood -     Ambulatory referral to Psychology  Use ankle ace wrap and tennis shoes to help provide more ankle support Apply cold compress at least once a day x 41mins. Perform calf and  ankle exercise once a day. Use wrist support and large mouse.  Problem List Items Addressed This Visit    None    Visit Diagnoses    Chronic pain of right ankle    -  Primary   Left wrist tendonitis       Adjustment disorder with mixed anxiety and depressed mood       Relevant Orders   Ambulatory referral to Psychology      Follow-up: Return if symptoms worsen or fail to improve.  Wilfred Lacy, NP

## 2020-09-21 NOTE — Patient Instructions (Signed)
Use ankle ace wrap and tennis shoes to help provide more ankle support Apply cold compress at least once a day x 24mins. Perform calf and ankle exercise once a day.  Use wrist support and large mouse.   Ankle Exercises Ask your health care provider which exercises are safe for you. Do exercises exactly as told by your health care provider and adjust them as directed. It is normal to feel mild stretching, pulling, tightness, or mild discomfort as you do these exercises. Stop right away if you feel sudden pain or your pain gets worse. Do not begin these exercises until told by your health care provider. Stretching and range-of-motion exercises These exercises warm up your muscles and joints and improve the movement and flexibility of your ankle. These exercises may also help to relieve pain. Dorsiflexion/plantar flexion  1. Sit with your __________ knee straight or bent. Do not rest your foot on anything. 2. Flex your __________ ankle to tilt the top of your foot toward your shin. This is called dorsiflexion. 3. Hold this position for __________ seconds. 4. Point your toes downward to tilt the top of your foot away from your shin. This is called plantar flexion. 5. Hold this position for __________ seconds. Repeat __________ times. Complete this exercise __________ times a day. Ankle alphabet  1. Sit with your __________ foot supported at your lower leg. ? Do not rest your foot on anything. ? Make sure your foot has room to move freely. 2. Think of your __________ foot as a paintbrush: ? Move your foot to trace each letter of the alphabet in the air. Keep your hip and knee still while you trace the letters. Trace every letter from A to Z. ? Make the letters as large as you can without causing or increasing any discomfort. Repeat __________ times. Complete this exercise __________ times a day. Passive ankle dorsiflexion This is an exercise in which something or someone moves your ankle for  you. You do not move it yourself. 1. Sit on a chair that is placed on a non-carpeted surface. 2. Place your __________ foot on the floor, directly under your __________ knee. Extend your __________ leg for support. 3. Keeping your heel down, slide your __________ foot back toward the chair until you feel a stretch at your ankle or calf. If you do not feel a stretch, slide your buttocks forward to the edge of the chair while keeping your heel down. 4. Hold this stretch for __________ seconds. Repeat __________ times. Complete this exercise __________ times a day. Strengthening exercises These exercises build strength and endurance in your ankle. Endurance is the ability to use your muscles for a long time, even after they get tired. Dorsiflexors These are muscles that lift your foot up. 1. Secure a rubber exercise band or tube to an object, such as a table leg, that will stay still when the band is pulled. Secure the other end around your __________ foot. 2. Sit on the floor, facing the object with your __________ leg extended. The band or tube should be slightly tense when your foot is relaxed. 3. Slowly flex your __________ ankle and toes to bring your foot toward your shin. 4. Hold this position for __________ seconds. 5. Slowly return your foot to the starting position, controlling the band as you do that. Repeat __________ times. Complete this exercise __________ times a day. Plantar flexors These are muscles that push your foot down. 1. Sit on the floor with your __________ leg  extended. 2. Loop a rubber exercise band or tube around the ball of your __________ foot. The ball of your foot is on the walking surface, right under your toes. The band or tube should be slightly tense when your foot is relaxed. 3. Slowly point your toes downward, pushing them away from you. 4. Hold this position for __________ seconds. 5. Slowly release the tension in the band or tube, controlling smoothly until  your foot is back in the starting position. Repeat __________ times. Complete this exercise __________ times a day. Towel curls  1. Sit in a chair on a non-carpeted surface, and put your feet on the floor. 2. Place a towel in front of your feet. If told by your health care provider, add a __________ pound weight to the end of the towel. 3. Keeping your heel on the floor, put your __________ foot on the towel. 4. Pull the towel toward you by grabbing the towel with your toes and curling them under. Keep your heel on the floor. 5. Let your toes relax. 6. Grab the towel again. Keep pulling the towel until it is completely underneath your foot. Repeat __________ times. Complete this exercise __________ times a day. Standing plantar flexion This is an exercise in which you use your toes to lift your body's weight while standing. 1. Stand with your feet shoulder-width apart. 2. Keep your weight spread evenly over the width of your feet while you rise up on your toes. Use a wall or table to steady yourself if needed, but try not to use it for support. 3. If this exercise is too easy, try these options: ? Shift your weight toward your __________ leg until you feel challenged. ? If told by your health care provider, lift your uninjured leg off the floor. 4. Hold this position for __________ seconds. Repeat __________ times. Complete this exercise __________ times a day. Tandem walking 1. Stand with one foot directly in front of the other. 2. Slowly raise your back foot up, lifting your heel before your toes, and place it directly in front of your other foot. 3. Continue to walk in this heel-to-toe way for __________ or for as long as told by your health care provider. Have a countertop or wall nearby to use if needed to keep your balance, but try not to hold onto anything for support. Repeat __________ times. Complete this exercise __________ times a day. This information is not intended to replace  advice given to you by your health care provider. Make sure you discuss any questions you have with your health care provider. Document Revised: 07/04/2018 Document Reviewed: 07/06/2018 Elsevier Patient Education  [employer].

## 2020-10-07 IMAGING — DX DG CHEST 1V PORT
1 series · 1 of 1 positions shown · non-contrast
Comparison: None.

CLINICAL DATA: Cough and congestion

EXAM:
PORTABLE CHEST 1 VIEW

[chest ap]
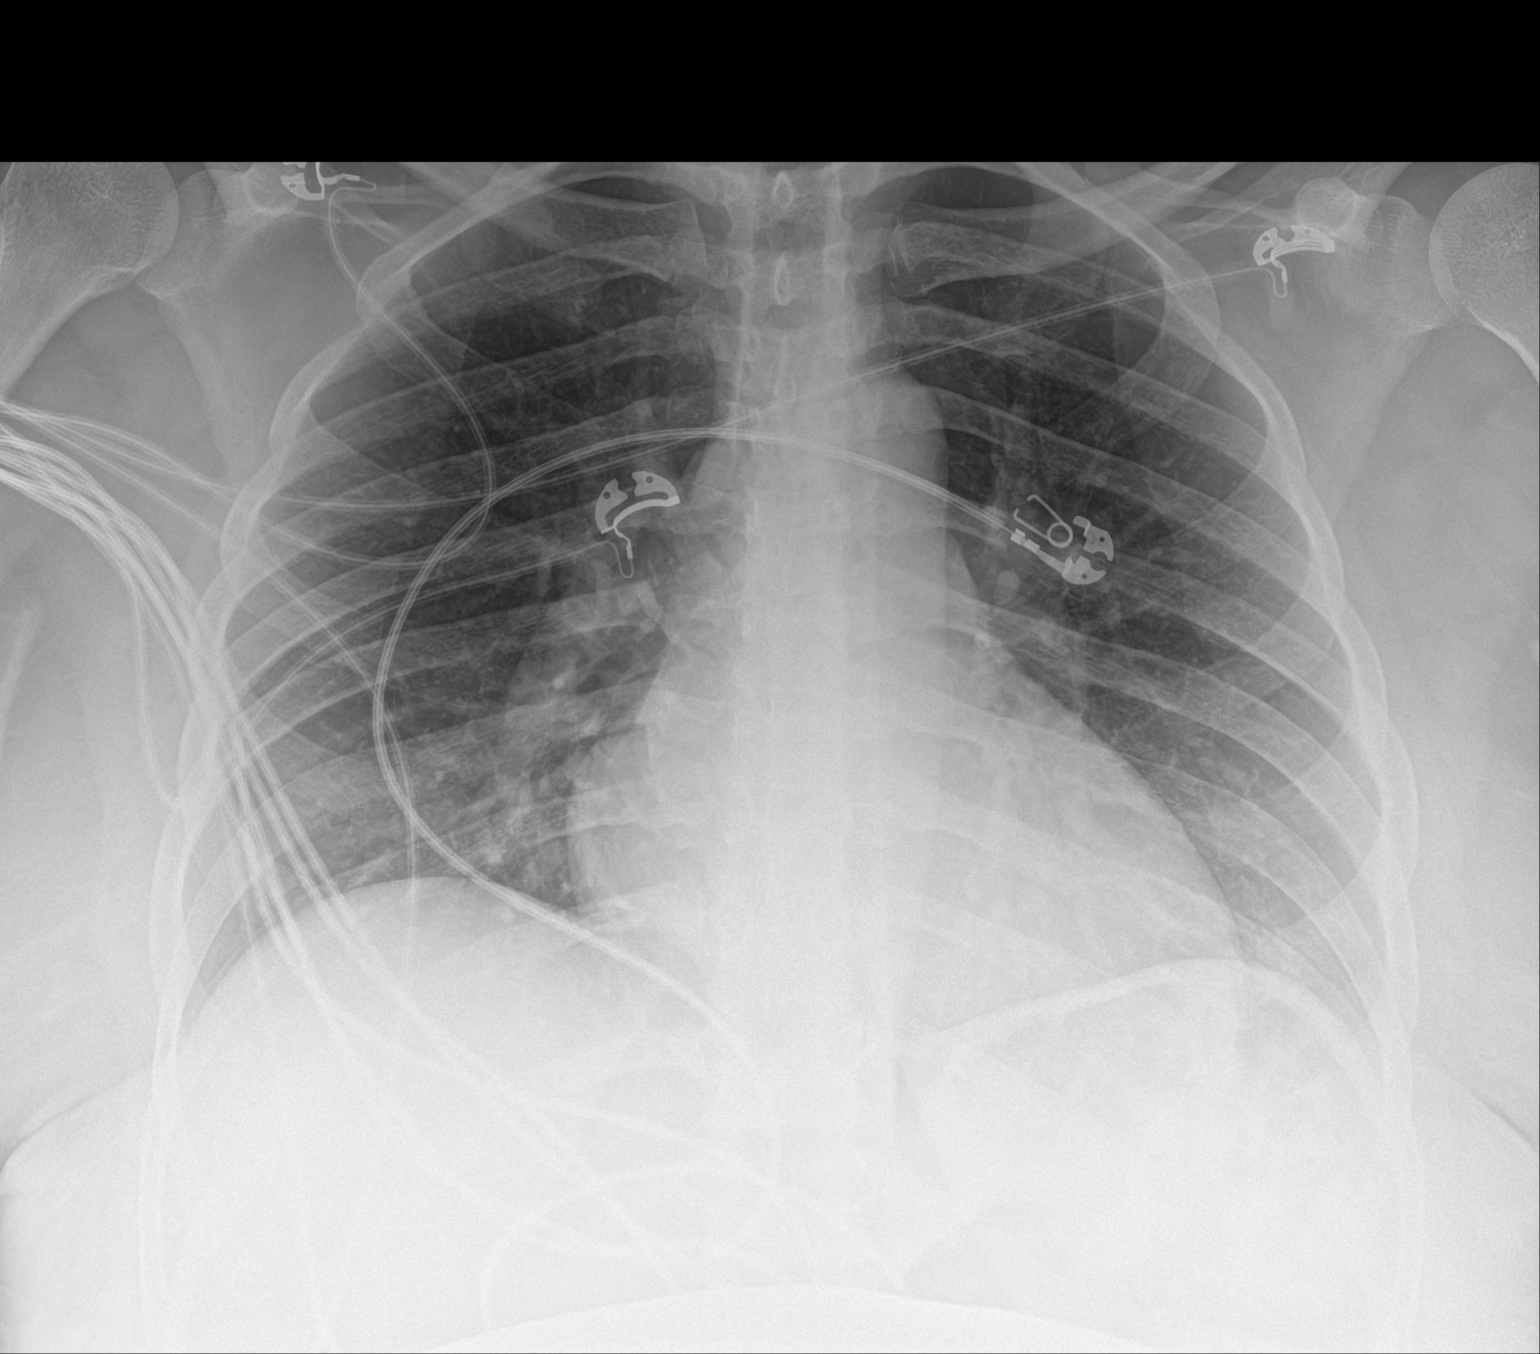

[1 of 1 positions shown; findings below may reference images not displayed]

FINDINGS: The heart size and mediastinal contours are within normal limits.
Both lungs are clear. The visualized skeletal structures are
unremarkable.
IMPRESSION: No active disease.

## 2020-10-11 ENCOUNTER — Ambulatory Visit: Payer: No Typology Code available for payment source | Admitting: Psychology

## 2020-10-27 ENCOUNTER — Ambulatory Visit (INDEPENDENT_AMBULATORY_CARE_PROVIDER_SITE_OTHER): Payer: No Typology Code available for payment source | Admitting: Psychology

## 2020-10-27 DIAGNOSIS — F4323 Adjustment disorder with mixed anxiety and depressed mood: Secondary | ICD-10-CM | POA: Diagnosis not present

## 2020-11-06 ENCOUNTER — Ambulatory Visit (INDEPENDENT_AMBULATORY_CARE_PROVIDER_SITE_OTHER): Payer: Self-pay | Admitting: Family Medicine

## 2020-11-08 ENCOUNTER — Ambulatory Visit (INDEPENDENT_AMBULATORY_CARE_PROVIDER_SITE_OTHER): Payer: No Typology Code available for payment source | Admitting: Psychology

## 2020-11-08 DIAGNOSIS — F4323 Adjustment disorder with mixed anxiety and depressed mood: Secondary | ICD-10-CM

## 2020-11-16 ENCOUNTER — Encounter (INDEPENDENT_AMBULATORY_CARE_PROVIDER_SITE_OTHER): Payer: Self-pay | Admitting: Family Medicine

## 2020-11-16 ENCOUNTER — Ambulatory Visit (INDEPENDENT_AMBULATORY_CARE_PROVIDER_SITE_OTHER): Payer: No Typology Code available for payment source | Admitting: Family Medicine

## 2020-11-16 ENCOUNTER — Other Ambulatory Visit: Payer: Self-pay

## 2020-11-16 VITALS — BP 123/84 | HR 64 | Temp 97.7°F | Ht 65.0 in | Wt 249.0 lb

## 2020-11-16 DIAGNOSIS — Z1331 Encounter for screening for depression: Secondary | ICD-10-CM | POA: Diagnosis not present

## 2020-11-16 DIAGNOSIS — D509 Iron deficiency anemia, unspecified: Secondary | ICD-10-CM

## 2020-11-16 DIAGNOSIS — O139 Gestational [pregnancy-induced] hypertension without significant proteinuria, unspecified trimester: Secondary | ICD-10-CM

## 2020-11-16 DIAGNOSIS — D649 Anemia, unspecified: Secondary | ICD-10-CM

## 2020-11-16 DIAGNOSIS — R0602 Shortness of breath: Secondary | ICD-10-CM

## 2020-11-16 DIAGNOSIS — Z9189 Other specified personal risk factors, not elsewhere classified: Secondary | ICD-10-CM

## 2020-11-16 DIAGNOSIS — R5383 Other fatigue: Secondary | ICD-10-CM | POA: Diagnosis not present

## 2020-11-16 DIAGNOSIS — Z6841 Body Mass Index (BMI) 40.0 and over, adult: Secondary | ICD-10-CM

## 2020-11-16 DIAGNOSIS — E559 Vitamin D deficiency, unspecified: Secondary | ICD-10-CM | POA: Diagnosis not present

## 2020-11-16 DIAGNOSIS — Z0289 Encounter for other administrative examinations: Secondary | ICD-10-CM

## 2020-11-16 DIAGNOSIS — R748 Abnormal levels of other serum enzymes: Secondary | ICD-10-CM

## 2020-11-17 LAB — COMPREHENSIVE METABOLIC PANEL
ALT: 12 IU/L (ref 0–32)
AST: 14 IU/L (ref 0–40)
Albumin/Globulin Ratio: 1.5 (ref 1.2–2.2)
Albumin: 4 g/dL (ref 3.8–4.8)
Alkaline Phosphatase: 85 IU/L (ref 44–121)
BUN/Creatinine Ratio: 10 (ref 9–23)
BUN: 9 mg/dL (ref 6–24)
Bilirubin Total: 0.3 mg/dL (ref 0.0–1.2)
CO2: 23 mmol/L (ref 20–29)
Calcium: 9.6 mg/dL (ref 8.7–10.2)
Chloride: 105 mmol/L (ref 96–106)
Creatinine, Ser: 0.88 mg/dL (ref 0.57–1.00)
GFR calc Af Amer: 94 mL/min/{1.73_m2} (ref >59)
GFR calc non Af Amer: 81 mL/min/{1.73_m2} (ref >59)
Globulin, Total: 2.7 g/dL (ref 1.5–4.5)
Glucose: 85 mg/dL (ref 65–99)
Potassium: 4.2 mmol/L (ref 3.5–5.2)
Sodium: 140 mmol/L (ref 134–144)
Total Protein: 6.7 g/dL (ref 6.0–8.5)

## 2020-11-17 LAB — VITAMIN D 25 HYDROXY (VIT D DEFICIENCY, FRACTURES): Vit D, 25-Hydroxy: 5.1 ng/mL — ABNORMAL LOW (ref 30.0–100.0)

## 2020-11-17 LAB — CBC WITH DIFFERENTIAL/PLATELET
Basophils Absolute: 0.1 10*3/uL (ref 0.0–0.2)
Basos: 1 %
EOS (ABSOLUTE): 0.1 10*3/uL (ref 0.0–0.4)
Eos: 2 %
Hematocrit: 40.9 % (ref 34.0–46.6)
Hemoglobin: 13 g/dL (ref 11.1–15.9)
Immature Grans (Abs): 0 10*3/uL (ref 0.0–0.1)
Immature Granulocytes: 0 %
Lymphocytes Absolute: 2.9 10*3/uL (ref 0.7–3.1)
Lymphs: 44 %
MCH: 27.6 pg (ref 26.6–33.0)
MCHC: 31.8 g/dL (ref 31.5–35.7)
MCV: 87 fL (ref 79–97)
Monocytes Absolute: 0.4 10*3/uL (ref 0.1–0.9)
Monocytes: 6 %
Neutrophils Absolute: 3.2 10*3/uL (ref 1.4–7.0)
Neutrophils: 47 %
Platelets: 328 10*3/uL (ref 150–450)
RBC: 4.71 x10E6/uL (ref 3.77–5.28)
RDW: 13.1 % (ref 11.7–15.4)
WBC: 6.7 10*3/uL (ref 3.4–10.8)

## 2020-11-17 LAB — LIPID PANEL
Chol/HDL Ratio: 2.1 ratio (ref 0.0–4.4)
Cholesterol, Total: 185 mg/dL (ref 100–199)
HDL: 88 mg/dL (ref >39)
LDL Chol Calc (NIH): 87 mg/dL (ref 0–99)
Triglycerides: 50 mg/dL (ref 0–149)
VLDL Cholesterol Cal: 10 mg/dL (ref 5–40)

## 2020-11-17 LAB — FOLATE: Folate: 3.9 ng/mL (ref >3.0)

## 2020-11-17 LAB — T3: T3, Total: 112 ng/dL (ref 71–180)

## 2020-11-17 LAB — HEMOGLOBIN A1C
Est. average glucose Bld gHb Est-mCnc: 114 mg/dL
Hgb A1c MFr Bld: 5.6 % (ref 4.8–5.6)

## 2020-11-17 LAB — VITAMIN B12: Vitamin B-12: 209 pg/mL — ABNORMAL LOW (ref 232–1245)

## 2020-11-17 LAB — INSULIN, RANDOM: INSULIN: 5.3 u[IU]/mL (ref 2.6–24.9)

## 2020-11-17 LAB — T4, FREE: Free T4: 1.1 ng/dL (ref 0.82–1.77)

## 2020-11-17 LAB — TSH: TSH: 1.58 u[IU]/mL (ref 0.450–4.500)

## 2020-11-20 ENCOUNTER — Ambulatory Visit (INDEPENDENT_AMBULATORY_CARE_PROVIDER_SITE_OTHER): Payer: Self-pay | Admitting: Family Medicine

## 2020-11-20 NOTE — Progress Notes (Signed)
Chief Complaint:   OBESITY Julia Ibarra (MR# [id number]) is a 45 y.o. female who presents for evaluation and treatment of obesity and related comorbidities. Current BMI is Body mass index is 41.44 kg/m. Julia Ibarra has been struggling with her weight for many years and has been unsuccessful in either losing weight, maintaining weight loss, or reaching her healthy weight goal.  Julia Ibarra is currently in the action stage of change and ready to dedicate time achieving and maintaining a healthier weight. Julia Ibarra is interested in becoming our patient and working on intensive lifestyle modifications including (but not limited to) diet and exercise for weight loss.  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 He live with her husband Julia Ibarra and 2 children 39 and 72 year old. She skips breakfast regularly and occasionally dinner.  Julia Ibarra's habits were reviewed today and are as follows: Her family eats meals together, she thinks her family will eat healthier with her, her desired weight loss is 75 lbs, she has been heavy most of her life, she started gaining weight at age 60, her heaviest weight ever was 235 lbs pounds, she has significant food cravings issues, she skips meals frequently, she is frequently drinking liquids with calories, she frequently makes poor food choices, she has problems with excessive hunger and she struggles with emotional eating.  This is the patient's first visit at [facility].  The patient's NEW PATIENT PACKET that they filled out prior to today's office visit was reviewed at length and information from that paperwork was included within the following office visit note.    Included in the packet: current and past health history, medications, allergies, ROS, gynecologic history (women only), surgical history, family history, social history, weight history, weight loss surgery history (for those that have had weight loss surgery),  nutritional evaluation, mood and food questionnaire along with a depression screening (PHQ9) on all patients, an Epworth questionnaire, sleep habits questionnaire, patient life and health improvement goals questionnaire. These will all be scanned into the patient's chart under media.   During the visit, I independently reviewed the patient's EKG, bioimpedance scale results, and indirect calorimeter results. I used this information to tailor a meal plan for the patient that will help Julia Ibarra to lose weight and will improve her obesity-related conditions going forward.  I performed a medically necessary appropriate examination and/or evaluation. I discussed the assessment and treatment plan with the patient. The patient was provided an opportunity to ask questions and all were answered. The patient agreed with the plan and demonstrated an understanding of the instructions. Labs were ordered today (unless patient declined them) and will be reviewed with the patient at our next visit unless more critical results need to be addressed immediately. Clinical information was updated and documented in the EMR.  Time spent on visit including pre-visit chart review and post-visit care was estimated to be 60 minutes.   Depression Screen Julia Ibarra's Food and Mood (modified PHQ-9) score was 8.  Depression screen PHQ 2/9 11/16/2020  Decreased Interest 1  Down, Depressed, Hopeless 2  PHQ - 2 Score 3  Altered sleeping 0  Tired, decreased energy 3  Change in appetite 1  Feeling bad or failure about yourself  1  Trouble concentrating 0  Moving slowly or fidgety/restless 0  Suicidal thoughts 0  PHQ-9 Score 8  Difficult doing work/chores Not difficult at all    Assessment/Plan:   1. Other fatigue Julia Ibarra admits to daytime somnolence and  admits to waking up still tired. Patent has a history of symptoms of daytime fatigue. Julia Ibarra generally gets 6 hours of sleep per night, and states that she has generally restful  sleep. Snoring is present. Apneic episodes are not present. Epworth Sleepiness Score is 6.  Plan: Julia Ibarra does feel that her weight is causing her energy to be lower than it should be. Fatigue may be related to obesity, depression or many other causes. Labs will be ordered, and in the meanwhile, Julia Ibarra will focus on self care including making healthy food choices, increasing physical activity and focusing on stress reduction.  Lab/Orders today or future: - EKG 12-Lead - Vitamin B12 - CBC with Differential/Platelet - Comprehensive metabolic panel - Folate - Hemoglobin A1c - Insulin, random - Lipid panel - T3 - T4, free - TSH - VITAMIN D 25 Hydroxy (Vit-D Deficiency, Fractures)  2. SOBOE (shortness of breath on exertion) Julia Ibarra notes increasing shortness of breath with exercising and seems to be worsening over time with weight gain. She notes getting out of breath sooner with activity than she used to. This has gotten worse recently. Julia Ibarra denies shortness of breath at rest or orthopnea.  Plan: Julia Ibarra does feel that she gets out of breath more easily that she used to when she exercises. Julia Ibarra's shortness of breath appears to be obesity related and exercise induced. She has agreed to work on weight loss and gradually increase exercise to treat her exercise induced shortness of breath. Will continue to monitor closely.  Lab/Orders today or future: - Vitamin B12 - CBC with Differential/Platelet - Comprehensive metabolic panel - Folate - Hemoglobin A1c - Insulin, random - Lipid panel - T3 - T4, free - TSH - VITAMIN D 25 Hydroxy (Vit-D Deficiency, Fractures)  3. Vitamin D deficiency Julia Ibarra is currently taking no vitamin D supplement. She notes fatigue and was told in the past that she is Vit D deficient.   Plan: Check labs today. Vit D therapy pending lab results. Low Vitamin D level contributes to fatigue and are associated with obesity, breast, and colon cancer. She agrees  to follow-up for routine testing of Vitamin D, at least 2-3 times per year to avoid over-replacement.  Lab/Orders today or future: - Vitamin B12 - CBC with Differential/Platelet - Comprehensive metabolic panel - Folate - Hemoglobin A1c - Insulin, random - Lipid panel - T3 - T4, free - TSH - VITAMIN D 25 Hydroxy (Vit-D Deficiency, Fractures)   4. Pregnancy induced hypertension, antepartum Pt has a history of hypertension associated with pregnancy and is not on medication.  BP Readings from Last 3 Encounters:  11/16/20 123/84  09/21/20 124/82  07/03/20 136/84   Plan: Check labs today. BP stable today.  Lab/Orders today or future: - Vitamin B12 - CBC with Differential/Platelet - Comprehensive metabolic panel - Folate - Hemoglobin A1c - Insulin, random - Lipid panel - T3 - T4, free - TSH - VITAMIN D 25 Hydroxy (Vit-D Deficiency, Fractures)  5. Elevated liver enzymes Pt has history of elevated LFTs with pregnancy and with recent COVID infection past. She saw GI for it. She is followed every 6-12 months by them.  Lab/Orders today or future: - Vitamin B12 - CBC with Differential/Platelet - Comprehensive metabolic panel - Folate - Hemoglobin A1c - Insulin, random - Lipid panel - T3 - T4, free - TSH - VITAMIN D 25 Hydroxy (Vit-D Deficiency, Fractures)  6. Iron deficiency anemia, unspecified iron deficiency anemia type Pt has history of anemia associated with pregnancy. She is  not on a supplement currently. She does note fatigue and SOB.  Plan: Check labs today. Treatment plan pending labs results.  Lab/Orders today or future: - Vitamin B12 - CBC with Differential/Platelet - Comprehensive metabolic panel - Folate - Hemoglobin A1c - Insulin, random - Lipid panel - T3 - T4, free - TSH - VITAMIN D 25 Hydroxy (Vit-D Deficiency, Fractures)  7. Depression screening Lynne had a positive depression screening. Depression is commonly associated with obesity and  often results in emotional eating behaviors. We will monitor this closely and work on CBT to help improve the non-hunger eating patterns. Referral to Psychology may be required if no improvement is seen as she continues in our clinic.  8. At risk for impaired metabolic function Naola was given approximately 30 minutes of impaired  metabolic function prevention counseling today. We discussed intensive lifestyle modifications today with an emphasis on specific nutrition and exercise instructions and strategies.   9. Class 3 severe obesity with serious comorbidity and body mass index (BMI) of 40.0 to 44.9 in adult, unspecified obesity type (HCC) Blia is currently in the action stage of change and her goal is to continue with weight loss efforts. I recommend Farheen begin the structured treatment plan as follows:  She has agreed to the Category 3 Plan.  Exercise goals: As is   Behavioral modification strategies: increasing lean protein intake, increasing water intake, meal planning and cooking strategies, keeping healthy foods in the home and planning for success.  She was informed of the importance of frequent follow-up visits to maximize her success with intensive lifestyle modifications for her multiple health conditions. She was informed we would discuss her lab results at her next visit unless there is a critical issue that needs to be addressed sooner. Maleeha agreed to keep her next visit at the agreed upon time to discuss these results.  Objective:   Blood pressure 123/84, pulse 64, temperature 97.7 F (36.5 C), height 5\' 5"  (1.651 m), weight 249 lb (112.9 kg), SpO2 99 %, unknown if currently breastfeeding. Body mass index is 41.44 kg/m.  EKG: Normal sinus rhythm, rate 75.  Indirect Calorimeter completed today shows a VO2 of 337 and a REE of 2343.  Her calculated basal metabolic rate is [id number] thus her basal metabolic rate is better than expected.  General: Cooperative, alert, well  developed, in no acute distress. HEENT: Conjunctivae and lids unremarkable. Cardiovascular: Regular rhythm.  Lungs: Normal work of breathing. Neurologic: No focal deficits.   Lab Results  Component Value Date   CREATININE 0.88 11/16/2020   BUN 9 11/16/2020   NA 140 11/16/2020   K 4.2 11/16/2020   CL 105 11/16/2020   CO2 23 11/16/2020   Lab Results  Component Value Date   ALT 12 11/16/2020   AST 14 11/16/2020   ALKPHOS 85 11/16/2020   BILITOT 0.3 11/16/2020   Lab Results  Component Value Date   HGBA1C 5.6 11/16/2020   HGBA1C 5.2 12/03/2017   Lab Results  Component Value Date   INSULIN 5.3 11/16/2020   INSULIN 7.6 12/03/2017   Lab Results  Component Value Date   TSH 1.580 11/16/2020   Lab Results  Component Value Date   CHOL 185 11/16/2020   HDL 88 11/16/2020   LDLCALC 87 11/16/2020   TRIG 50 11/16/2020   CHOLHDL 2.1 11/16/2020   Lab Results  Component Value Date   WBC 6.7 11/16/2020   HGB 13.0 11/16/2020   HCT 40.9 11/16/2020   MCV 87 11/16/2020  PLT 328 11/16/2020   Lab Results  Component Value Date   FERRITIN 103 09/22/2019   Attestation Statements:   Reviewed by clinician on day of visit: allergies, medications, problem list, medical history, surgical history, family history, social history, and previous encounter notes.  Coral Ceo, am acting as Location manager for [employer], DO.  I have reviewed the above documentation for accuracy and completeness, and I agree with the above. Marjory Sneddon, D.O.  The [place] was signed into law in 2016 which includes the topic of electronic health records.  This provides immediate access to information in MyChart.  This includes consultation notes, operative notes, office notes, lab results and pathology reports.  If you have any questions about what you read please let us know at your next visit so we can discuss your concerns and take corrective action if need be.  We are  right here with you.

## 2020-11-24 ENCOUNTER — Ambulatory Visit: Payer: No Typology Code available for payment source | Admitting: Psychology

## 2020-11-29 ENCOUNTER — Encounter (INDEPENDENT_AMBULATORY_CARE_PROVIDER_SITE_OTHER): Payer: Self-pay | Admitting: Family Medicine

## 2020-11-29 ENCOUNTER — Other Ambulatory Visit (INDEPENDENT_AMBULATORY_CARE_PROVIDER_SITE_OTHER): Payer: Self-pay | Admitting: Family Medicine

## 2020-11-29 ENCOUNTER — Ambulatory Visit (INDEPENDENT_AMBULATORY_CARE_PROVIDER_SITE_OTHER): Payer: No Typology Code available for payment source | Admitting: Family Medicine

## 2020-11-29 ENCOUNTER — Other Ambulatory Visit: Payer: Self-pay

## 2020-11-29 VITALS — BP 138/88 | HR 68 | Temp 98.0°F | Ht 65.0 in | Wt 248.0 lb

## 2020-11-29 DIAGNOSIS — E559 Vitamin D deficiency, unspecified: Secondary | ICD-10-CM | POA: Diagnosis not present

## 2020-11-29 DIAGNOSIS — Z9189 Other specified personal risk factors, not elsewhere classified: Secondary | ICD-10-CM

## 2020-11-29 DIAGNOSIS — E538 Deficiency of other specified B group vitamins: Secondary | ICD-10-CM | POA: Diagnosis not present

## 2020-11-29 DIAGNOSIS — E8881 Metabolic syndrome: Secondary | ICD-10-CM

## 2020-11-29 DIAGNOSIS — R748 Abnormal levels of other serum enzymes: Secondary | ICD-10-CM | POA: Diagnosis not present

## 2020-11-29 DIAGNOSIS — E88819 Insulin resistance, unspecified: Secondary | ICD-10-CM | POA: Insufficient documentation

## 2020-11-29 DIAGNOSIS — Z6841 Body Mass Index (BMI) 40.0 and over, adult: Secondary | ICD-10-CM

## 2020-11-29 IMAGING — US US HEPATIC LIVER DOPPLER
1 series · 13 of 25 positions shown · non-contrast
Comparison: None.

CLINICAL DATA: 43-year-old female with elevated liver enzymes.

EXAM:
DUPLEX ULTRASOUND OF LIVER
TECHNIQUE: Color and duplex Doppler ultrasound was performed to evaluate the
hepatic in-flow and out-flow vessels.

[Series 1: us hepatic liver doppler · 13 of 69 slices shown]
[im 1/69]
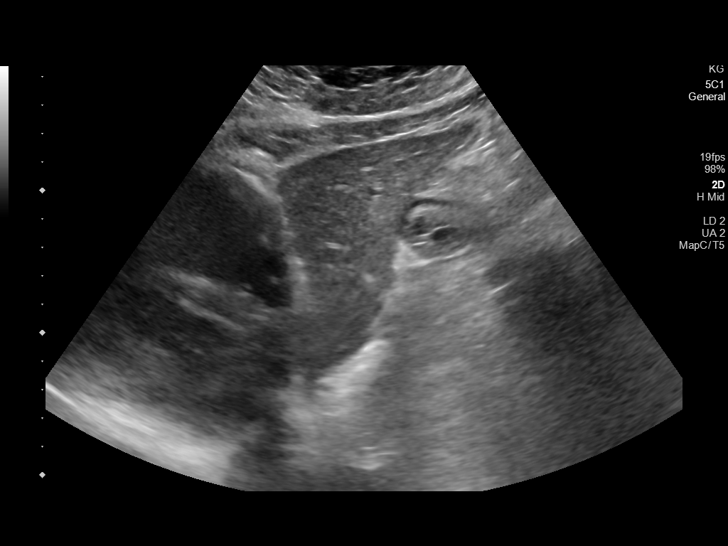
[im 6/69]
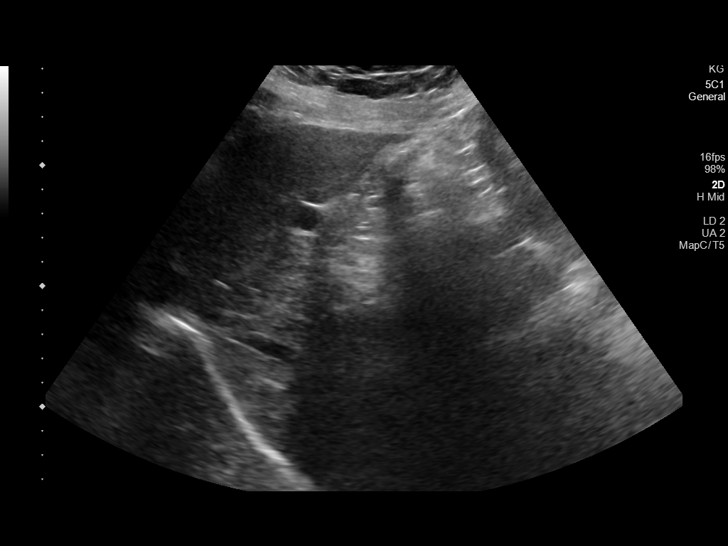
[im 12/69]
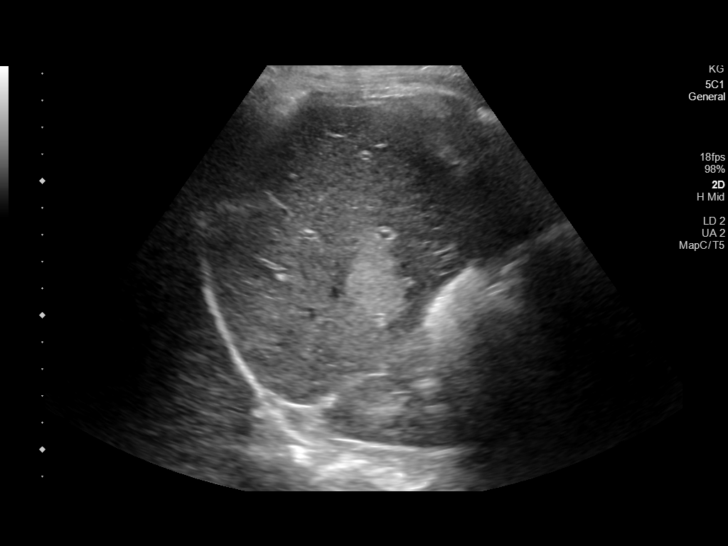
[im 18/69]
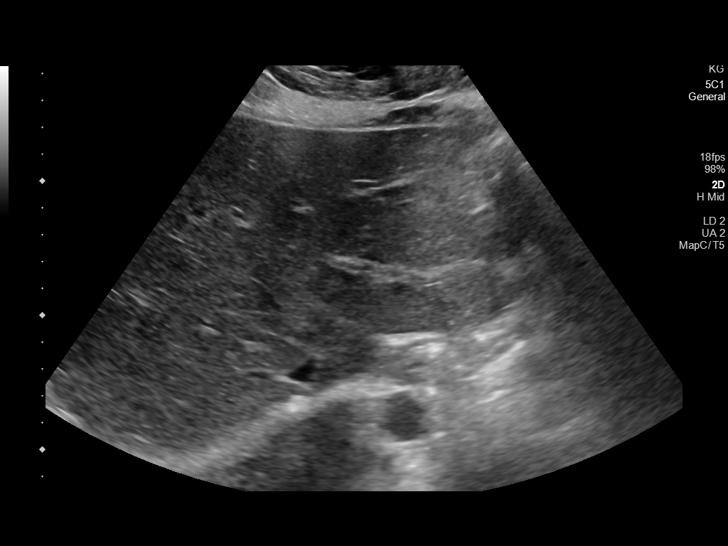
[im 23/69]
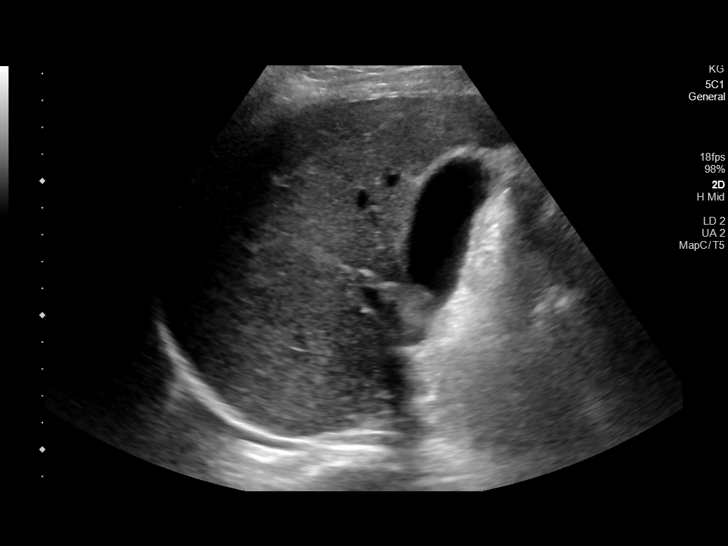
[im 29/69]
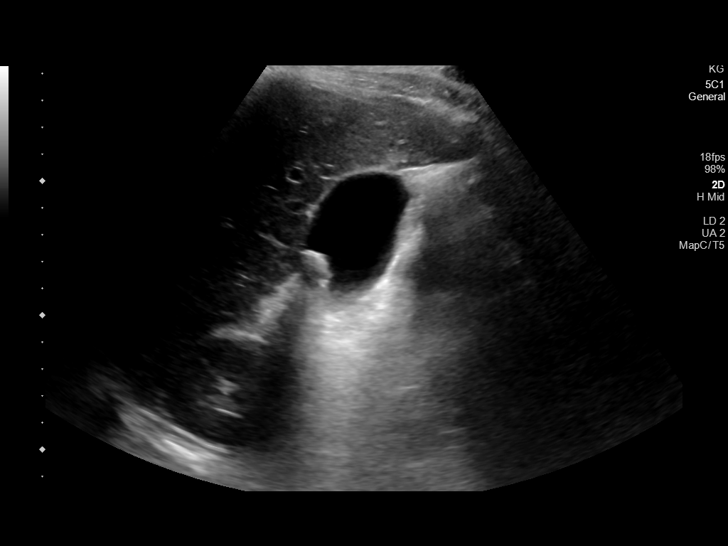
[im 35/69]
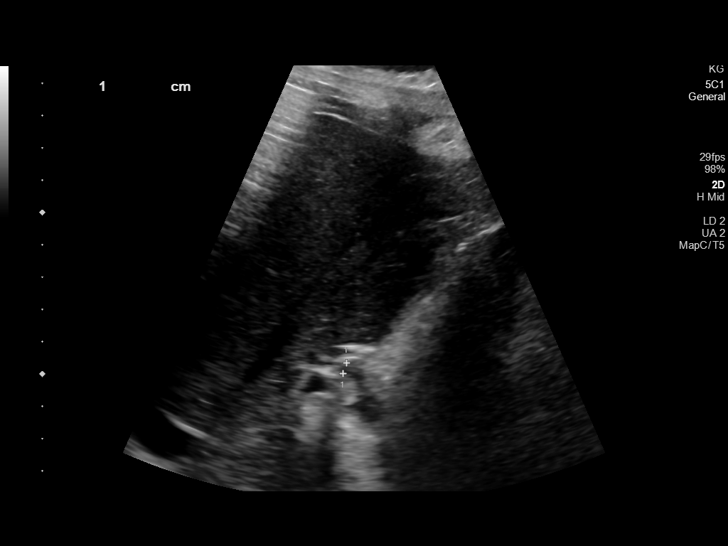
[im 40/69]
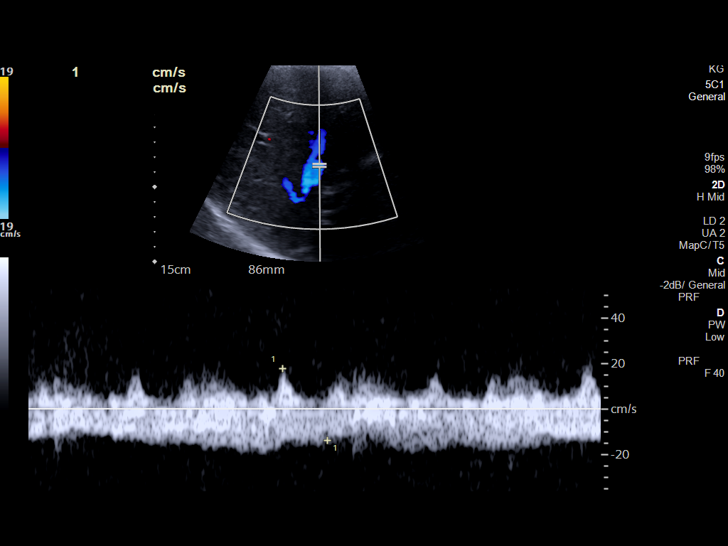
[im 46/69]
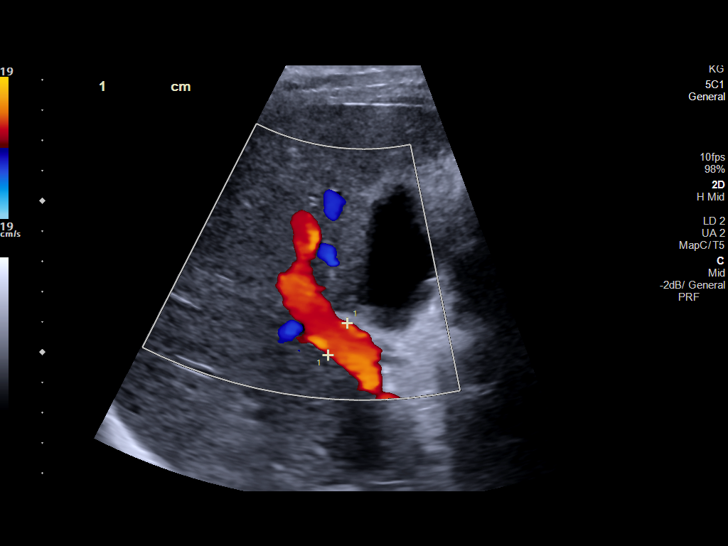
[im 52/69]
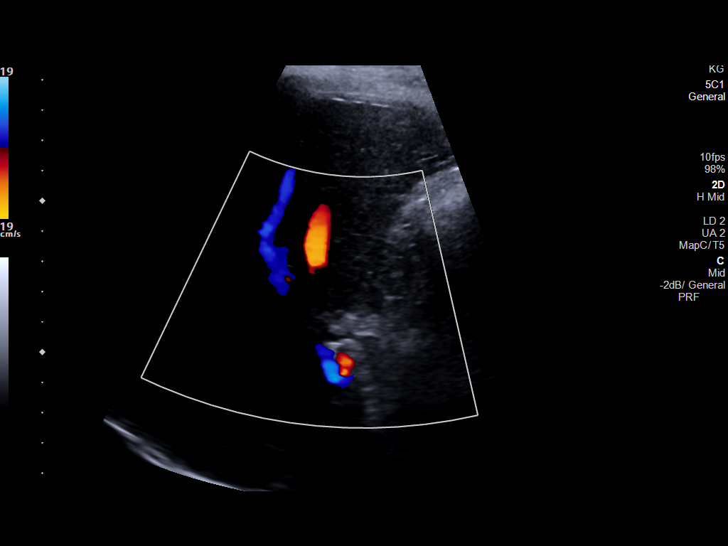
[im 57/69]
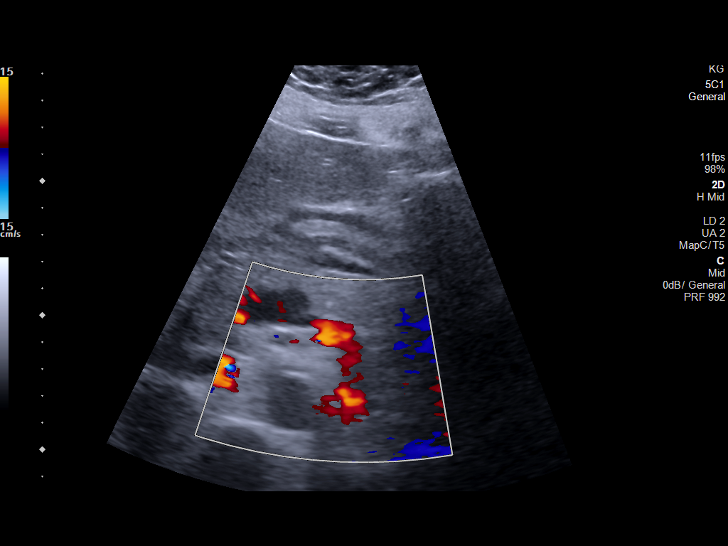
[im 63/69]
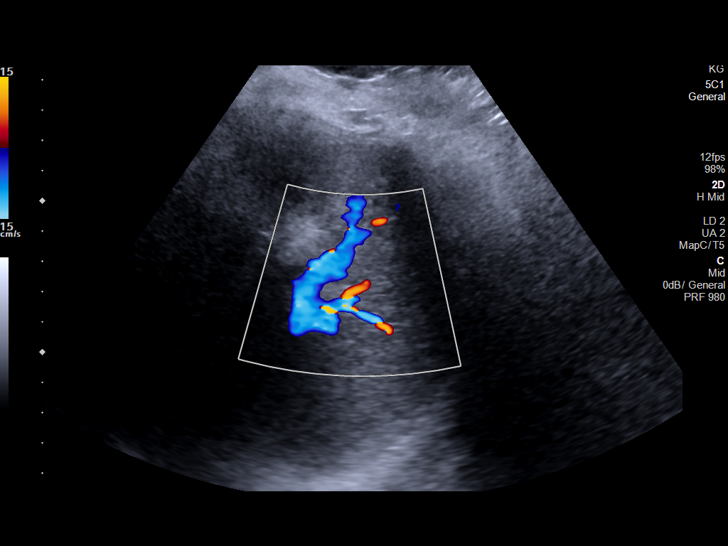
[im 69/69]
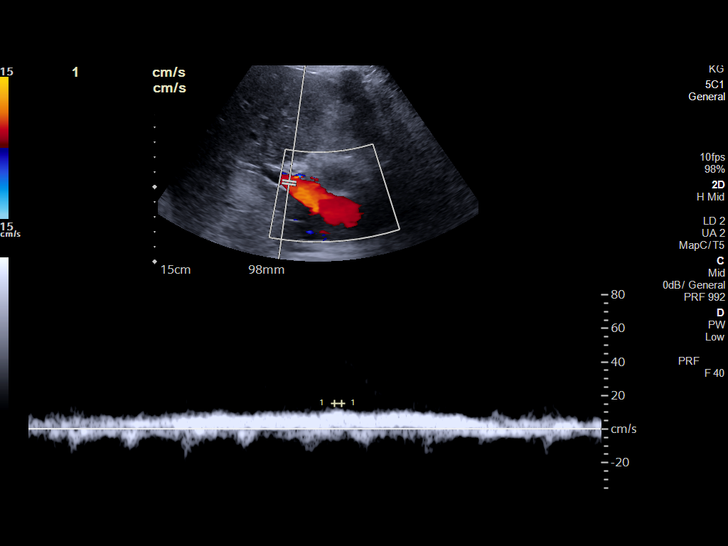

[13 of 25 positions shown; findings below may reference images not displayed]

FINDINGS: Liver: Normal parenchymal echogenicity. Normal hepatic contour
without nodularity.

Relatively circumscribed 2.3 x 3.0 x 2.5 cm echogenic soft tissue
mass within the central aspect of the liver. No significant internal
vascularity on color Doppler evaluation. Echogenic foci are present
within the gallbladder lumen consistent with cholelithiasis. There
is posterior acoustic shadowing. No gallbladder distension,
pericholecystic fluid or wall thickening.

Main Portal Vein size: 1.2 cm

Portal Vein Velocities

Main Prox:  19 cm/sec

Main Mid: 18 cm/sec

Main Dist:  15 cm/sec
Right: 11 cm/sec
Left: 10 cm/sec

Hepatic Vein Velocities

Right:  24 cm/sec

Middle:  18 cm/sec

Left:  18 cm/sec

IVC: Present and patent with normal respiratory phasicity.

Hepatic Artery Velocity:  49 cm/sec

Splenic Vein Velocity:  54 cm/sec

Spleen: 8.2 cm x 9.1 cm x 5.9 cm with a total volume of 232 cm^3
(411 cm^3 is upper limit normal)

Portal Vein Occlusion/Thrombus: No

Splenic Vein Occlusion/Thrombus: No

Ascites: None

Varices: None
IMPRESSION: 1. Cholelithiasis without evidence of biliary dilation or
cholecystitis.
2. Approximately 3 cm circumscribed echogenic mass within the
central aspect of the liver. While nonspecific the imaging
characteristics are most suggestive of a benign hemangioma. Consider
follow-up with 1 additional right upper quadrant ultrasound in 3-6
months to confirm stability.
3. Patent portal and hepatic veins without evidence of thrombosis.

## 2020-11-29 MED ORDER — CYANOCOBALAMIN 500 MCG PO TABS: 500.0000 ug | ORAL_TABLET | Freq: Every day | ORAL | Status: DC

## 2020-11-29 MED ORDER — VITAMIN D (ERGOCALCIFEROL) 1.25 MG (50000 UNIT) PO CAPS: 50000.0000 [IU] | ORAL_CAPSULE | ORAL | 0 refills | Status: DC

## 2020-11-29 MED FILL — VIT D2 1.25 MG (50,000 UNIT: 1.25 MG | 28 days supply | Qty: 4 | Fill #0

## 2020-12-04 NOTE — Progress Notes (Signed)
Chief Complaint:   OBESITY Julia Ibarra is here to discuss her progress with her obesity treatment plan along with follow-up of her obesity related diagnoses.   Today's visit was #: 2 Starting weight: 249 lbs Starting date: 11/16/2020 Today's weight: 248 lbs Today's date: 11/29/2020 Total lbs lost to date: 1 lb Body mass index is 41.27 kg/m.  Total weight loss percentage to date: -0.40%  Interim History:   Julia Ibarra is here today to review her NEW Meal Plan and to discuss all recent labs done here and/ or done at outside facilities. This is patient's first follow up visit. Extended time was spent counseling Julia Ibarra on all new disease processes that were discovered or that are worsening.   Julia Ibarra cannot eat all her proteins at lunch/dinner as protein is too large.  Denies hunger/cravings.  Nutrition Plan: Category 3 Plan for 80% of the time. Activity: Walking for 30 mintues 2 times per week.  Assessment/Plan:   1. Vitamin D deficiency New.  Discussed labs with patient today.  Not at goal. Current vitamin D is 5.1, tested on 11/16/2020. Optimal goal > 50 ng/dL.   Plan: Start to take prescription Vitamin D @50 ,000 IU every week as prescribed.  Follow-up for routine testing of Vitamin D, at least 2-3 times per year to avoid over-replacement.  - Start Vitamin D, Ergocalciferol, (DRISDOL) 1.25 MG (50000 UNIT) CAPS capsule; Take 1 capsule (50,000 Units total) by mouth every 7 (seven) days.  Dispense: 4 capsule; Refill: 0  2. B12 deficiency Lab Results  Component Value Date   VITAMINB12 209 (L) 11/16/2020   New.  Discussed labs with patient today.  Supplementation: None.   Plan:  Start vitamin B12 supplement, as per below.  - Start vitamin B-12 (CYANOCOBALAMIN) 500 MCG tablet; Take 1 tablet (500 mcg total) by mouth daily.  Dispense: 30 tablet  3. Elevated liver enzymes Discussed labs with patient today.  History of elevation with COVID infection.  Seen by GI.   Workup negative.    Plan:  Labs within normal limits.  Lab Results  Component Value Date   ALT 12 11/16/2020   AST 14 11/16/2020   ALKPHOS 85 11/16/2020   BILITOT 0.3 11/16/2020   4. Insulin resistance New.  Discussed labs with patient today.  At goal. Goal is HgbA1c < 5.7, fasting insulin closer to 5.  Medication: None.    Plan:  Consider medication in the future.  She will continue to focus on protein-rich, low simple carbohydrate foods. We reviewed the importance of hydration, regular exercise for stress reduction, and restorative sleep.   Lab Results  Component Value Date   HGBA1C 5.6 11/16/2020   Lab Results  Component Value Date   INSULIN 5.3 11/16/2020   INSULIN 7.6 12/03/2017   5. At risk for impaired metabolic function Due to Julia Ibarra's current state of health and medical condition(s), she is at a significantly higher risk for impaired metabolic function.  This places the patient at a much greater risk to subsequently develop cardiopulmonary conditions that can negatively affect the patient's quality of life.  At least 23 minutes was spent on counseling [name] about these concerns today, and I stressed the importance of reversing these risks factors.  The initial goal is to lose at least 5-10% of starting weight to help reduce risk factors.  Counseling:  Intensive lifestyle modifications discussed with Julia Ibarra as the most appropriate first line treatment.  she will continue to work on diet, exercise, and  weight loss efforts.  We will continue to reassess these conditions on a fairly regular basis in an attempt to decrease the patient's overall morbidity and mortality.  6. Class 3 severe obesity with serious comorbidity and body mass index (BMI) of 40.0 to 44.9 in adult, unspecified obesity type ([place])  Course: Julia Ibarra is currently in the action stage of change. As such, her goal is to continue with weight loss efforts.   Nutrition goals: She has agreed to the Category 3 Plan.    Exercise goals: As is.  Behavioral modification strategies: increasing lean protein intake, decreasing simple carbohydrates, increasing water intake, decreasing sodium intake, no skipping meals, meal planning and cooking strategies and planning for success.  [name] has agreed to follow-up with our clinic in 2 weeks. She was informed of the importance of frequent follow-up visits to maximize her success with intensive lifestyle modifications for her multiple health conditions.   Objective:   Blood pressure 138/88, pulse 68, temperature 98 F (36.7 C), height 5\' 5"  (1.651 m), weight 248 lb (112.5 kg), SpO2 97 %, unknown if currently breastfeeding. Body mass index is 41.27 kg/m.  General: Cooperative, alert, well developed, in no acute distress. HEENT: Conjunctivae and lids unremarkable. Cardiovascular: Regular rhythm.  Lungs: Normal work of breathing. Neurologic: No focal deficits.   Lab Results  Component Value Date   CREATININE 0.88 11/16/2020   BUN 9 11/16/2020   NA 140 11/16/2020   K 4.2 11/16/2020   CL 105 11/16/2020   CO2 23 11/16/2020   Lab Results  Component Value Date   ALT 12 11/16/2020   AST 14 11/16/2020   ALKPHOS 85 11/16/2020   BILITOT 0.3 11/16/2020   Lab Results  Component Value Date   HGBA1C 5.6 11/16/2020   HGBA1C 5.2 12/03/2017   Lab Results  Component Value Date   INSULIN 5.3 11/16/2020   INSULIN 7.6 12/03/2017   Lab Results  Component Value Date   TSH 1.580 11/16/2020   Lab Results  Component Value Date   CHOL 185 11/16/2020   HDL 88 11/16/2020   LDLCALC 87 11/16/2020   TRIG 50 11/16/2020   CHOLHDL 2.1 11/16/2020   Lab Results  Component Value Date   WBC 6.7 11/16/2020   HGB 13.0 11/16/2020   HCT 40.9 11/16/2020   MCV 87 11/16/2020   PLT 328 11/16/2020   Lab Results  Component Value Date   FERRITIN 103 09/22/2019   Attestation Statements:   Reviewed by clinician on day of visit: allergies, medications, problem list,  medical history, surgical history, family history, social history, and previous encounter notes.  I, Water quality scientist, CMA, am acting as Location manager for [employer], DO.  I have reviewed the above documentation for accuracy and completeness, and I agree with the above. Marjory Sneddon, D.O.  The [place] was signed into law in 2016 which includes the topic of electronic health records.  This provides immediate access to information in MyChart.  This includes consultation notes, operative notes, office notes, lab results and pathology reports.  If you have any questions about what you read please let us know at your next visit so we can discuss your concerns and take corrective action if need be.  We are right here with you.

## 2020-12-06 ENCOUNTER — Ambulatory Visit (INDEPENDENT_AMBULATORY_CARE_PROVIDER_SITE_OTHER): Payer: No Typology Code available for payment source | Admitting: Psychology

## 2020-12-06 DIAGNOSIS — F4323 Adjustment disorder with mixed anxiety and depressed mood: Secondary | ICD-10-CM | POA: Diagnosis not present

## 2020-12-07 ENCOUNTER — Telehealth: Payer: Self-pay | Admitting: Nurse Practitioner

## 2020-12-07 NOTE — Telephone Encounter (Signed)
Pt called and said the [facility] wants her to be seen for Anxiety before 12/20/20 but we dont have any 30 minute slots besides the [place] slots, would it be okay to use one of those or what would you want me to do?

## 2020-12-08 NOTE — Telephone Encounter (Signed)
Scheduled Monday at 11

## 2020-12-08 NOTE — Telephone Encounter (Signed)
Ok to use hospital f/up slot

## 2020-12-09 MED FILL — VIT D2 1.25 MG (50,000 UNIT: 1.25 MG | 28 days supply | Qty: 4 | Fill #0

## 2020-12-18 ENCOUNTER — Other Ambulatory Visit: Payer: Self-pay

## 2020-12-18 ENCOUNTER — Ambulatory Visit (INDEPENDENT_AMBULATORY_CARE_PROVIDER_SITE_OTHER): Payer: No Typology Code available for payment source | Admitting: Nurse Practitioner

## 2020-12-18 ENCOUNTER — Other Ambulatory Visit: Payer: Self-pay | Admitting: Nurse Practitioner

## 2020-12-18 ENCOUNTER — Encounter: Payer: Self-pay | Admitting: Nurse Practitioner

## 2020-12-18 VITALS — BP 130/84 | HR 77 | Temp 97.3°F | Ht 66.0 in | Wt 247.8 lb

## 2020-12-18 DIAGNOSIS — F32A Depression, unspecified: Secondary | ICD-10-CM

## 2020-12-18 DIAGNOSIS — F329 Major depressive disorder, single episode, unspecified: Secondary | ICD-10-CM | POA: Diagnosis not present

## 2020-12-18 MED ORDER — BUPROPION HCL ER (SR) 100 MG PO TB12: ORAL_TABLET | ORAL | 5 refills | Status: DC

## 2020-12-18 MED FILL — BUPROPION HCL SR 100 MG TAB: 100 | 30 days supply | Qty: 60 | Fill #0

## 2020-12-18 NOTE — Progress Notes (Signed)
Subjective:  Patient ID: Julia Ibarra, female    DOB: September 22, 1976  Age: 45 y.o. MRN: [id number]  CC: Acute Visit (Pt would like to discuss anxiety, states her therapist suggested getting an appointment with her PCP to discuss medication options.  )  Depression      The patient presents with depression.  This is a recurrent problem.  The current episode started more than 1 month ago.   The onset quality is gradual.   The problem occurs constantly.  The problem has been gradually worsening since onset.  Associated symptoms include decreased concentration, fatigue, irritable, decreased interest and myalgias.  Associated symptoms include no helplessness, no hopelessness, does not have insomnia, no restlessness, no appetite change, no body aches, no headaches, no indigestion, not sad and no suicidal ideas.     The symptoms are aggravated by work stress and family issues.  Past treatments include psychotherapy.  Compliance with treatment is good.  Previous treatment provided no relief relief.  Past medical history includes anxiety and depression.     Pertinent negatives include no chronic pain, no hypothyroidism, no thyroid problem, no eating disorder, no obsessive-compulsive disorder, no suicide attempts and no head trauma.  Reviewed past Medical, Social and Family history today.  Outpatient Medications Prior to Visit  Medication Sig Dispense Refill  . acetaminophen (TYLENOL) 500 MG tablet Take 1,000 mg by mouth every 6 (six) hours as needed for mild pain or headache.    . vitamin B-12 (CYANOCOBALAMIN) 500 MCG tablet Take 1 tablet (500 mcg total) by mouth daily. 30 tablet   . Vitamin D, Ergocalciferol, (DRISDOL) 1.25 MG (50000 UNIT) CAPS capsule Take 1 capsule (50,000 Units total) by mouth every 7 (seven) days. 4 capsule 0   No facility-administered medications prior to visit.   ROS See HPI  Objective:  BP 130/84 (BP Location: Left Arm, Patient Position: Sitting, Cuff Size: Large)   Pulse 77    Temp (!) 97.3 F (36.3 C) (Temporal)   Ht 5\' 6"  (1.676 m)   Wt 247 lb 12.8 oz (112.4 kg)   SpO2 98%   Breastfeeding No   BMI 40.00 kg/m   Physical Exam Constitutional:      General: She is irritable.  Neurological:     Mental Status: She is alert and oriented to person, place, and time.  Psychiatric:        Attention and Perception: Attention normal.        Mood and Affect: Affect is tearful.        Speech: Speech normal.        Behavior: Behavior is cooperative.        Thought Content: Thought content normal.        Cognition and Memory: Cognition normal.        Judgment: Judgment normal.    Assessment & Plan:  This visit occurred during the SARS-CoV-2 public health emergency.  Safety protocols were in place, including screening questions prior to the visit, additional usage of staff PPE, and extensive cleaning of exam room while observing appropriate contact time as indicated for disinfecting solutions.   Pieper was seen today for acute visit.  Diagnoses and all orders for this visit:  Depressive disorder -     buPROPion (WELLBUTRIN SR) 100 MG 12 hr tablet; 1tab in AM x 2weeks, then 1tab in AM and PM continuously    Problem List Items Addressed This Visit   None   Visit Diagnoses    Depressive disorder    -  Primary   Relevant Medications   buPROPion (WELLBUTRIN SR) 100 MG 12 hr tablet      Follow-up: Return in about 4 weeks (around 01/15/2021) for anxiety and depression (video or F2F, 53mins).  Wilfred Lacy, NP

## 2020-12-18 NOTE — Patient Instructions (Signed)
Start wellbutrin Continue counseling sessions. F/up in 48month

## 2020-12-19 ENCOUNTER — Other Ambulatory Visit: Payer: Self-pay

## 2020-12-19 DIAGNOSIS — D1800 Hemangioma unspecified site: Secondary | ICD-10-CM

## 2020-12-19 DIAGNOSIS — R7989 Other specified abnormal findings of blood chemistry: Secondary | ICD-10-CM

## 2020-12-20 ENCOUNTER — Encounter (INDEPENDENT_AMBULATORY_CARE_PROVIDER_SITE_OTHER): Payer: Self-pay | Admitting: Family Medicine

## 2020-12-20 ENCOUNTER — Other Ambulatory Visit (INDEPENDENT_AMBULATORY_CARE_PROVIDER_SITE_OTHER): Payer: Self-pay | Admitting: Family Medicine

## 2020-12-20 ENCOUNTER — Other Ambulatory Visit: Payer: Self-pay

## 2020-12-20 ENCOUNTER — Ambulatory Visit (INDEPENDENT_AMBULATORY_CARE_PROVIDER_SITE_OTHER): Payer: No Typology Code available for payment source | Admitting: Psychology

## 2020-12-20 ENCOUNTER — Ambulatory Visit (INDEPENDENT_AMBULATORY_CARE_PROVIDER_SITE_OTHER): Payer: No Typology Code available for payment source | Admitting: Family Medicine

## 2020-12-20 VITALS — BP 139/80 | HR 70 | Temp 97.8°F | Ht 65.0 in | Wt 244.0 lb

## 2020-12-20 DIAGNOSIS — E559 Vitamin D deficiency, unspecified: Secondary | ICD-10-CM

## 2020-12-20 DIAGNOSIS — F32A Depression, unspecified: Secondary | ICD-10-CM | POA: Insufficient documentation

## 2020-12-20 DIAGNOSIS — Z6841 Body Mass Index (BMI) 40.0 and over, adult: Secondary | ICD-10-CM

## 2020-12-20 DIAGNOSIS — F329 Major depressive disorder, single episode, unspecified: Secondary | ICD-10-CM | POA: Insufficient documentation

## 2020-12-20 DIAGNOSIS — F3289 Other specified depressive episodes: Secondary | ICD-10-CM | POA: Diagnosis not present

## 2020-12-20 DIAGNOSIS — F4323 Adjustment disorder with mixed anxiety and depressed mood: Secondary | ICD-10-CM

## 2020-12-20 DIAGNOSIS — Z9189 Other specified personal risk factors, not elsewhere classified: Secondary | ICD-10-CM

## 2020-12-20 DIAGNOSIS — E538 Deficiency of other specified B group vitamins: Secondary | ICD-10-CM | POA: Diagnosis not present

## 2020-12-20 DIAGNOSIS — F325 Major depressive disorder, single episode, in full remission: Secondary | ICD-10-CM | POA: Insufficient documentation

## 2020-12-20 MED ORDER — VITAMIN D (ERGOCALCIFEROL) 1.25 MG (50000 UNIT) PO CAPS: 50000.0000 [IU] | ORAL_CAPSULE | ORAL | 0 refills | Status: DC

## 2020-12-20 MED ORDER — CYANOCOBALAMIN 500 MCG PO TABS: 500.0000 ug | ORAL_TABLET | Freq: Every day | ORAL | Status: AC

## 2020-12-21 NOTE — Progress Notes (Signed)
Chief Complaint:   OBESITY Julia Ibarra is here to discuss her progress with her obesity treatment plan along with follow-up of her obesity related diagnoses.   Today's visit was #: 3 Starting weight: 249 lbs Starting date: 11/16/2020 Today's weight: 244 lbs Today's date: 12/20/2020 Total lbs lost to date: 5 lbs Body mass index is 40.6 kg/m.  Total weight loss percentage to date: -2.01%  Interim History:  Julia Ibarra is here for a follow up office visit.  she is following the meal plan without concern or issues.  Patient's meal and food recall appears to be accurate and consistent with what is on the plan.  When on plan, her hunger and cravings are well controlled.    Plan:  Recipes given to patient today.  Current Meal Plan: the Category 3 Plan for 90% of the time.  Current Exercise Plan: Walking for 20 minutes 3-4 times per week.  Assessment/Plan:   No orders of the defined types were placed in this encounter.   Medications Discontinued During This Encounter  Medication Reason  . Vitamin D, Ergocalciferol, (DRISDOL) 1.25 MG (50000 UNIT) CAPS capsule Reorder  . vitamin B-12 (CYANOCOBALAMIN) 500 MCG tablet Reorder     Meds ordered this encounter  Medications  . vitamin B-12 (CYANOCOBALAMIN) 500 MCG tablet    Sig: Take 1 tablet (500 mcg total) by mouth daily.    Dispense:  30 tablet  . Vitamin D, Ergocalciferol, (DRISDOL) 1.25 MG (50000 UNIT) CAPS capsule    Sig: Take 1 capsule (50,000 Units total) by mouth every 7 (seven) days.    Dispense:  4 capsule    Refill:  0     1. Vitamin D deficiency Not at goal. Current vitamin D is 5.1, tested on 11/16/2020. Optimal goal > 50 ng/dL.  She is taking vitamin D 50,000 IU daily.   Plan: Continue to take prescription Vitamin D @50 ,000 IU every week as prescribed.  Follow-up for routine testing of Vitamin D, at least 2-3 times per year to avoid over-replacement.  - Refill Vitamin D, Ergocalciferol, (DRISDOL) 1.25 MG (50000  UNIT) CAPS capsule; Take 1 capsule (50,000 Units total) by mouth every 7 (seven) days.  Dispense: 4 capsule; Refill: 0  2. B12 deficiency Lab Results  Component Value Date   VITAMINB12 209 (L) 11/16/2020   Julia Ibarra is taking vitamin B12 500 mcg daily.  Plan:  Continue vitamin B12 supplement.  Will refill today, as per below.   - Refill vitamin B-12 (CYANOCOBALAMIN) 500 MCG tablet; Take 1 tablet (500 mcg total) by mouth daily.  Dispense: 30 tablet  3. Other depression with emotional eating Medication: Wellbutrin 100 mg twice daily.  PCP started her on Wellbutrin at the end of February.  Tolerating it well.  No side effects.  Sees a counselor every 1-2 weeks.  Plan:  Meditation, increase exercise, increase frequency of counseling (consider EAP visits in between other counseling visits).  Behavior modification techniques were discussed today to help deal with emotional/non-hunger eating behaviors.  4. At risk for impaired metabolic function Due to Julia Ibarra's current state of health and medical condition(s), she is at a significantly higher risk for impaired metabolic function.  This places the patient at a much greater risk to subsequently develop cardiopulmonary conditions that can negatively affect the patient's quality of life.  At least 15 minutes was spent on counseling Julia Ibarra about these concerns today, and I stressed the importance of reversing these risks factors.  The initial goal is to  lose at least 5-10% of starting weight to help reduce risk factors.  Counseling:  Intensive lifestyle modifications discussed with Julia Ibarra as the most appropriate first line treatment.  she will continue to work on diet, exercise, and weight loss efforts.  We will continue to reassess these conditions on a fairly regular basis in an attempt to decrease the patient's overall morbidity and mortality.  5. Class 3 severe obesity with serious comorbidity and body mass index (BMI) of 40.0 to 44.9 in adult,  unspecified obesity type (Julia Ibarra)  Course: Julia Ibarra is currently in the action stage of change. As such, her goal is to continue with weight loss efforts.   Nutrition goals: She has agreed to the Category 3 Plan.   Exercise goals: As is and increase frequency as tolerated.  Behavioral modification strategies: meal planning and cooking strategies, avoiding temptations and planning for success.  Julia Ibarra has agreed to follow-up with our clinic in 2-2.5 weeks. She was informed of the importance of frequent follow-up visits to maximize her success with intensive lifestyle modifications for her multiple health conditions.   Objective:   Blood pressure 139/80, pulse 70, temperature 97.8 F (36.6 C), height 5\' 5"  (1.651 m), weight 244 lb (110.7 kg), SpO2 97 %, not currently breastfeeding. Body mass index is 40.6 kg/m.  General: Cooperative, alert, well developed, in no acute distress. HEENT: Conjunctivae and lids unremarkable. Cardiovascular: Regular rhythm.  Lungs: Normal work of breathing. Neurologic: No focal deficits.   Lab Results  Component Value Date   CREATININE 0.88 11/16/2020   BUN 9 11/16/2020   NA 140 11/16/2020   K 4.2 11/16/2020   CL 105 11/16/2020   CO2 23 11/16/2020   Lab Results  Component Value Date   ALT 12 11/16/2020   AST 14 11/16/2020   ALKPHOS 85 11/16/2020   BILITOT 0.3 11/16/2020   Lab Results  Component Value Date   HGBA1C 5.6 11/16/2020   HGBA1C 5.2 12/03/2017   Lab Results  Component Value Date   INSULIN 5.3 11/16/2020   INSULIN 7.6 12/03/2017   Lab Results  Component Value Date   TSH 1.580 11/16/2020   Lab Results  Component Value Date   CHOL 185 11/16/2020   HDL 88 11/16/2020   LDLCALC 87 11/16/2020   TRIG 50 11/16/2020   CHOLHDL 2.1 11/16/2020   Lab Results  Component Value Date   WBC 6.7 11/16/2020   HGB 13.0 11/16/2020   HCT 40.9 11/16/2020   MCV 87 11/16/2020   PLT 328 11/16/2020   Lab Results  Component Value Date    FERRITIN 103 09/22/2019   Attestation Statements:   Reviewed by clinician on day of visit: allergies, medications, problem list, medical history, surgical history, family history, social history, and previous encounter notes.  I, Water quality scientist, CMA, am acting as Location manager for [employer], DO.  I have reviewed the above documentation for accuracy and completeness, and I agree with the above. Marjory Sneddon, D.O.  The [place] was signed into law in 2016 which includes the topic of electronic health records.  This provides immediate access to information in MyChart.  This includes consultation notes, operative notes, office notes, lab results and pathology reports.  If you have any questions about what you read please let us know at your next visit so we can discuss your concerns and take corrective action if need be.  We are right here with you.

## 2020-12-31 ENCOUNTER — Ambulatory Visit (HOSPITAL_COMMUNITY)
Admission: RE | Admit: 2020-12-31 | Discharge: 2020-12-31 | Disposition: A | Discharge status: Home / Self Care | Payer: No Typology Code available for payment source | Source: Ambulatory Visit | Attending: Internal Medicine | Admitting: Internal Medicine

## 2020-12-31 ENCOUNTER — Other Ambulatory Visit: Payer: Self-pay

## 2020-12-31 DIAGNOSIS — D1800 Hemangioma unspecified site: Secondary | ICD-10-CM | POA: Diagnosis present

## 2020-12-31 DIAGNOSIS — R7989 Other specified abnormal findings of blood chemistry: Secondary | ICD-10-CM | POA: Diagnosis present

## 2020-12-31 MED ORDER — GADOBUTROL 1 MMOL/ML IV SOLN
10.0000 mL | Freq: Once | INTRAVENOUS | Status: AC | PRN
  Administered 2020-12-31: 10 mL via INTRAVENOUS

## 2021-01-03 ENCOUNTER — Ambulatory Visit (INDEPENDENT_AMBULATORY_CARE_PROVIDER_SITE_OTHER): Payer: No Typology Code available for payment source | Admitting: Psychology

## 2021-01-03 DIAGNOSIS — F4323 Adjustment disorder with mixed anxiety and depressed mood: Secondary | ICD-10-CM | POA: Diagnosis not present

## 2021-01-08 ENCOUNTER — Encounter (INDEPENDENT_AMBULATORY_CARE_PROVIDER_SITE_OTHER): Payer: Self-pay | Admitting: Family Medicine

## 2021-01-08 ENCOUNTER — Ambulatory Visit (INDEPENDENT_AMBULATORY_CARE_PROVIDER_SITE_OTHER): Payer: No Typology Code available for payment source | Admitting: Family Medicine

## 2021-01-08 ENCOUNTER — Other Ambulatory Visit: Payer: Self-pay

## 2021-01-08 ENCOUNTER — Other Ambulatory Visit (INDEPENDENT_AMBULATORY_CARE_PROVIDER_SITE_OTHER): Payer: Self-pay | Admitting: Family Medicine

## 2021-01-08 VITALS — BP 134/80 | HR 75 | Temp 97.6°F | Ht 65.0 in | Wt 245.0 lb

## 2021-01-08 DIAGNOSIS — E559 Vitamin D deficiency, unspecified: Secondary | ICD-10-CM | POA: Diagnosis not present

## 2021-01-08 DIAGNOSIS — Z6841 Body Mass Index (BMI) 40.0 and over, adult: Secondary | ICD-10-CM

## 2021-01-08 DIAGNOSIS — Z9189 Other specified personal risk factors, not elsewhere classified: Secondary | ICD-10-CM

## 2021-01-08 DIAGNOSIS — F3289 Other specified depressive episodes: Secondary | ICD-10-CM | POA: Diagnosis not present

## 2021-01-08 MED ORDER — VITAMIN D (ERGOCALCIFEROL) 1.25 MG (50000 UNIT) PO CAPS: 50000.0000 [IU] | ORAL_CAPSULE | ORAL | 0 refills | Status: DC
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
## 2021-01-09 DIAGNOSIS — Z9189 Other specified personal risk factors, not elsewhere classified: Secondary | ICD-10-CM | POA: Insufficient documentation

## 2021-01-15 NOTE — Progress Notes (Signed)
Chief Complaint:   OBESITY Julia Ibarra is here to discuss her progress with her obesity treatment plan along with follow-up of her obesity related diagnoses.   Today's visit was #: 4 Starting weight: 249 lbs Starting date: 11/16/2020 Today's weight: 245 lbs Today's date: 01/08/2021 Total lbs lost to date: 4 lbs Body mass index is 40.77 kg/m.  Total weight loss percentage to date: -1.61%  Interim History:  Julia Ibarra is here for a follow up office visit and she is following the meal plan without concerns or issues.  Patient's meal and food recall appears to be accurate and consistent with what is on the plan.  When on plan, her hunger and cravings are well controlled.    Current Meal Plan: the Category 3 Plan for 90% of the time.  Current Exercise Plan: Walking for 30 minutes 3 times per week.  Assessment/Plan:   1. Vitamin D deficiency Not at goal. Current vitamin D is 5.1, tested on 11/16/2020. Optimal goal > 50 ng/dL.  She is taking vitamin D 50,000 IU weekly.  Plan: Continue to take prescription Vitamin D @50 ,000 IU every week as prescribed.  Follow-up for routine testing of Vitamin D, at least 2-3 times per year to avoid over-replacement.  - Refill Vitamin D, Ergocalciferol, (DRISDOL) 1.25 MG (50000 UNIT) CAPS capsule; Take 1 capsule (50,000 Units total) by mouth every 7 (seven) days.  Dispense: 4 capsule; Refill: 0  2. Other depression with emotional eating Not at goal. Medication: Wellbutrin 100 mg twice daily.  Skipping meals and never enough time to herself.  Plan:  Behavior modification techniques were discussed today to help deal with emotional/non-hunger eating behaviors. Patient was referred to Dr. Mallie Mussel, our Bariatric Psychologist, for evaluation due to her elevated PHQ-9 score and significant struggles with emotional eating.  Continue Wellbutrin per PCP.  Continue counseling as scheduled as well.  3. At risk for anxiety Julia Ibarra was given approximately 8  minutes of anxiety risk counseling today. She has risk factors for anxiety. We discussed the importance of a healthy work life balance, a healthy relationship with food and a good support system.  4. Obesity, current BMI 40.9  Course: Julia Ibarra is currently in the action stage of change. As such, her goal is to continue with weight loss efforts.   Nutrition goals: She has agreed to the Category 3 Plan.   Exercise goals: As is.  Behavioral modification strategies: no skipping meals, meal planning and cooking strategies and planning for success.  Lashaunda has agreed to follow-up with our clinic in 2 weeks.  Referral to Dr. Mallie Mussel placed today.  She was informed of the importance of frequent follow-up visits to maximize her success with intensive lifestyle modifications for her multiple health conditions.   Objective:   Blood pressure 134/80, pulse 75, temperature 97.6 F (36.4 C), height 5\' 5"  (1.651 m), weight 245 lb (111.1 kg), SpO2 98 %, not currently breastfeeding. Body mass index is 40.77 kg/m.  General: Cooperative, alert, well developed, in no acute distress. HEENT: Conjunctivae and lids unremarkable. Cardiovascular: Regular rhythm.  Lungs: Normal work of breathing. Neurologic: No focal deficits.   Lab Results  Component Value Date   CREATININE 0.88 11/16/2020   BUN 9 11/16/2020   NA 140 11/16/2020   K 4.2 11/16/2020   CL 105 11/16/2020   CO2 23 11/16/2020   Lab Results  Component Value Date   ALT 12 11/16/2020   AST 14 11/16/2020   ALKPHOS 85 11/16/2020  BILITOT 0.3 11/16/2020   Lab Results  Component Value Date   HGBA1C 5.6 11/16/2020   HGBA1C 5.2 12/03/2017   Lab Results  Component Value Date   INSULIN 5.3 11/16/2020   INSULIN 7.6 12/03/2017   Lab Results  Component Value Date   TSH 1.580 11/16/2020   Lab Results  Component Value Date   CHOL 185 11/16/2020   HDL 88 11/16/2020   LDLCALC 87 11/16/2020   TRIG 50 11/16/2020   CHOLHDL 2.1 11/16/2020    Lab Results  Component Value Date   WBC 6.7 11/16/2020   HGB 13.0 11/16/2020   HCT 40.9 11/16/2020   MCV 87 11/16/2020   PLT 328 11/16/2020   Lab Results  Component Value Date   FERRITIN 103 09/22/2019   Attestation Statements:   Reviewed by clinician on day of visit: allergies, medications, problem list, medical history, surgical history, family history, social history, and previous encounter notes.  I, Water quality scientist, CMA, am acting as Location manager for [employer], DO.  I have reviewed the above documentation for accuracy and completeness, and I agree with the above. Marjory Sneddon, D.O.  The [place] was signed into law in 2016 which includes the topic of electronic health records.  This provides immediate access to information in MyChart.  This includes consultation notes, operative notes, office notes, lab results and pathology reports.  If you have any questions about what you read please let us know at your next visit so we can discuss your concerns and take corrective action if need be.  We are right here with you.   Medications Discontinued During This Encounter  Medication Reason  . Vitamin D, Ergocalciferol, (DRISDOL) 1.25 MG (50000 UNIT) CAPS capsule Reorder     Meds ordered this encounter  Medications  . Vitamin D, Ergocalciferol, (DRISDOL) 1.25 MG (50000 UNIT) CAPS capsule    Sig: Take 1 capsule (50,000 Units total) by mouth every 7 (seven) days.    Dispense:  4 capsule    Refill:  0

## 2021-01-16 ENCOUNTER — Telehealth (INDEPENDENT_AMBULATORY_CARE_PROVIDER_SITE_OTHER): Payer: No Typology Code available for payment source | Admitting: Psychology

## 2021-01-16 DIAGNOSIS — F411 Generalized anxiety disorder: Secondary | ICD-10-CM | POA: Diagnosis not present

## 2021-01-16 DIAGNOSIS — F3289 Other specified depressive episodes: Secondary | ICD-10-CM

## 2021-01-17 ENCOUNTER — Ambulatory Visit (INDEPENDENT_AMBULATORY_CARE_PROVIDER_SITE_OTHER): Payer: No Typology Code available for payment source | Admitting: Psychology

## 2021-01-17 DIAGNOSIS — F4323 Adjustment disorder with mixed anxiety and depressed mood: Secondary | ICD-10-CM | POA: Diagnosis not present

## 2021-01-18 ENCOUNTER — Telehealth (INDEPENDENT_AMBULATORY_CARE_PROVIDER_SITE_OTHER): Payer: No Typology Code available for payment source | Admitting: Nurse Practitioner

## 2021-01-18 ENCOUNTER — Encounter (INDEPENDENT_AMBULATORY_CARE_PROVIDER_SITE_OTHER): Payer: Self-pay

## 2021-01-18 ENCOUNTER — Encounter: Payer: Self-pay | Admitting: Nurse Practitioner

## 2021-01-18 ENCOUNTER — Other Ambulatory Visit: Payer: Self-pay | Admitting: Nurse Practitioner

## 2021-01-18 DIAGNOSIS — F329 Major depressive disorder, single episode, unspecified: Secondary | ICD-10-CM | POA: Diagnosis not present

## 2021-01-18 DIAGNOSIS — F32A Depression, unspecified: Secondary | ICD-10-CM

## 2021-01-18 MED ORDER — BUPROPION HCL ER (SR) 100 MG PO TB12
100.0000 mg | ORAL_TABLET | Freq: Two times a day (BID) | ORAL | 5 refills | Status: DC
Start: 2021-01-18 — End: 2021-01-18

## 2021-01-18 MED FILL — BUPROPION HCL SR 100 MG TAB: 100 | 30 days supply | Qty: 60 | Fill #0

## 2021-01-18 NOTE — Progress Notes (Signed)
  Office: [phone]  /  Fax: [phone]    Date: February 01, 2021   Appointment Start Time: 4:00pm Duration: 31 minutes Provider: Glennie Isle, Psy.D. Type of Session: Individual Therapy  Location of Patient: Parked in car outside of [facility]  Location of Provider: Provider's Home (private office) Type of Contact: Telepsychological Visit via MyChart Video Visit  Session Content: Julia Ibarra is a 45 y.o. female presenting for a follow-up appointment to address the previously established treatment goal of increasing coping skills. Today's appointment was a telepsychological visit due to COVID-19. Julia Ibarra provided verbal consent for today's telepsychological appointment and she is aware she is responsible for securing confidentiality on her end of the session. Prior to proceeding with today's appointment, Julia Ibarra's physical location at the time of this appointment was obtained as well a phone number she could be reached at in the event of technical difficulties. Julia Ibarra and this provider participated in today's telepsychological service. Notably, Julia Ibarra provided verbal consent to proceed via telephone at 4:09pm as Crook lost connection via Radiographer, therapeutic Visit.   This provider conducted a brief check-in. Julia Ibarra stated she is going to the gym daily during her lunch break. Reviewed emotional and physical hunger. She discussed she is experiencing ongoing challenges with eating all food on her meal plan, specifically her protein. Her eating habits were reviewed. It was reflected Julia Ibarra is going a long period between lunch and dinner without eating. As such, psychoeducation regarding the consequences of not eating regularly was provided as it relates to well-being and weight loss. To assist with eating regularly, she was engaged in problem solving. She was receptive to setting reminders on her phone to eat. Additionally, she was agreeable to engaging in some meal prep (e.g., taking  certain foods to leave at work for easy access; roasting vegetables for a few days). Julia Ibarra was receptive to today's appointment as evidenced by openness to sharing, responsiveness to feedback, and  willingness to implement discussed strategies.   Mental Status Examination:  Appearance: well groomed and appropriate hygiene  Behavior: appropriate to circumstances Mood: euthymic Affect: mood congruent Speech: normal in rate, volume, and tone Eye Contact: appropriate Psychomotor Activity: unable to assess  Gait: unable to assess Thought Process: linear, logical, and goal directed  Thought Content/Perception: no hallucinations, delusions, bizarre thinking or behavior reported or observed and no evidence or endorsement of suicidal and homicidal ideation, plan, and intent Orientation: time, person, place, and purpose of appointment Memory/Concentration: memory, attention, language, and fund of knowledge intact  Insight/Judgment: fair  Interventions:  Conducted a brief chart review Provided empathic reflections and validation Reviewed content from the previous session Employed supportive psychotherapy interventions to facilitate reduced distress and to improve coping skills with identified stressors Engaged patient in problem solving  DSM-5 Diagnosis(es): 311 (F32.8) Other Specified Depressive Disorder, Emotional Eating Behaviors and 300.02 (F41.1) Generalized Anxiety Disorder  Treatment Goal & Progress: During the initial appointment with this provider, the following treatment goal was established: increase coping skills. Progress is limited, as Julia Ibarra has just begun treatment with this provider; however, she is receptive to the interaction and interventions and rapport is being established.   Plan: The next appointment will be scheduled in 2-3 weeks, which will be via MyChart Video Visit. The next session will focus on working towards the established treatment goal.

## 2021-01-18 NOTE — Progress Notes (Signed)
Virtual Visit via Video Note  I connected with@ on 01/18/21 at  1:00 PM EDT by a video enabled telemedicine application and verified that I am speaking with the correct person using two identifiers.  Location: Patient:Home Provider: Office Participants: patient and provider  I discussed the limitations of evaluation and management by telemedicine and the availability of in person appointments. I also discussed with the patient that there may be a patient responsible charge related to this service. The patient expressed understanding and agreed to proceed.  BB:CWUGQBV and depression f/up  History of Present Illness: Depressive disorder Improving mood with wellbutrin 100mg  BID and therapy sessions every other week. She also started daily walks for exercise she denies an adverse side effects with medication.  Maintain medication dose F/up in 10month    Observations/Objective: Physical Exam Neurological:     Mental Status: She is alert and oriented to person, place, and time.  Psychiatric:        Mood and Affect: Mood normal.        Behavior: Behavior normal.        Thought Content: Thought content normal.    Assessment and Plan: Teva was seen today for follow-up.  Diagnoses and all orders for this visit:  Depressive disorder -     buPROPion (WELLBUTRIN SR) 100 MG 12 hr tablet; Take 1 tablet (100 mg total) by mouth 2 (two) times daily.   Follow Up Instructions: See above   I discussed the assessment and treatment plan with the patient. The patient was provided an opportunity to ask questions and all were answered. The patient agreed with the plan and demonstrated an understanding of the instructions.   The patient was advised to call back or seek an in-person evaluation if the symptoms worsen or if the condition fails to improve as anticipated.  Wilfred Lacy, NP

## 2021-01-18 NOTE — Assessment & Plan Note (Addendum)
Improving mood with wellbutrin 100mg  BID and therapy sessions every other week. She also started daily walks for exercise she denies an adverse side effects with medication.  Maintain medication dose F/up in 72month

## 2021-01-23 ENCOUNTER — Ambulatory Visit (INDEPENDENT_AMBULATORY_CARE_PROVIDER_SITE_OTHER): Payer: No Typology Code available for payment source | Admitting: Family Medicine

## 2021-01-31 ENCOUNTER — Ambulatory Visit (INDEPENDENT_AMBULATORY_CARE_PROVIDER_SITE_OTHER): Payer: No Typology Code available for payment source | Admitting: Psychology

## 2021-01-31 DIAGNOSIS — F4323 Adjustment disorder with mixed anxiety and depressed mood: Secondary | ICD-10-CM | POA: Diagnosis not present

## 2021-02-01 ENCOUNTER — Telehealth (INDEPENDENT_AMBULATORY_CARE_PROVIDER_SITE_OTHER): Payer: No Typology Code available for payment source | Admitting: Psychology

## 2021-02-01 DIAGNOSIS — F3289 Other specified depressive episodes: Secondary | ICD-10-CM | POA: Diagnosis not present

## 2021-02-01 DIAGNOSIS — F411 Generalized anxiety disorder: Secondary | ICD-10-CM

## 2021-02-06 NOTE — Progress Notes (Unsigned)
Office: [phone]  /  Fax: [phone]    Date: Feb 20, 2021   Appointment Start Time: *** Duration: *** minutes Provider: Glennie Isle, Psy.D. Type of Session: Individual Therapy  Location of Patient: {gbptloc:23249} Location of Provider: Provider's Home (private office) Type of Contact: Telepsychological Visit via MyChart Video Visit  Session Content: Julia Ibarra is a 45 y.o. female presenting for a follow-up appointment to address the previously established treatment goal of increasing coping skills. Today's appointment was a telepsychological visit due to COVID-19. Julia Ibarra provided verbal consent for today's telepsychological appointment and she is aware she is responsible for securing confidentiality on her end of the session. Prior to proceeding with today's appointment, Julia Ibarra's physical location at the time of this appointment was obtained as well a phone number she could be reached at in the event of technical difficulties. Julia Ibarra and this provider participated in today's telepsychological service.   This provider conducted a brief check-in and verbally administered the PHQ-9 and GAD-7. *** Julia Ibarra was receptive to today's appointment as evidenced by openness to sharing, responsiveness to feedback, and {gbreceptiveness:23401}.  Mental Status Examination:  Appearance: {Appearance:22431} Behavior: {Behavior:22445} Mood: {gbmood:21757} Affect: {Affect:22436} Speech: {Speech:22432} Eye Contact: {Eye Contact:22433} Psychomotor Activity: {Motor Activity:22434} Gait: {gbgait:23404} Thought Process: {thought process:22448}  Thought Content/Perception: {disturbances:22451} Orientation: {Orientation:22437} Memory/Concentration: {gbcognition:22449} Insight/Judgment: {Insight:22446}  Structured Assessments Results: The Patient Health Questionnaire-9 (PHQ-9) is a self-report measure that assesses symptoms and severity of depression over the course of the last two weeks. Julia Ibarra  obtained a score of *** suggesting {GBPHQ9SEVERITY:21752}. Julia Ibarra finds the endorsed symptoms to be {gbphq9difficulty:21754}. [0= Not at all; 1= Several days; 2= More than half the days; 3= Nearly every day] Little interest or pleasure in doing things ***  Feeling down, depressed, or hopeless ***  Trouble falling or staying asleep, or sleeping too much ***  Feeling tired or having little energy ***  Poor appetite or overeating ***  Feeling bad about yourself --- or that you are a failure or have let yourself or your family down ***  Trouble concentrating on things, such as reading the newspaper or watching television ***  Moving or speaking so slowly that other people could have noticed? Or the opposite --- being so fidgety or restless that you have been moving around a lot more than usual ***  Thoughts that you would be better off dead or hurting yourself in some way ***  PHQ-9 Score ***    The Generalized Anxiety Disorder-7 (GAD-7) is a brief self-report measure that assesses symptoms of anxiety over the course of the last two weeks. Julia Ibarra obtained a score of *** suggesting {gbgad7severity:21753}. Julia Ibarra finds the endorsed symptoms to be {gbphq9difficulty:21754}. [0= Not at all; 1= Several days; 2= Over half the days; 3= Nearly every day] Feeling nervous, anxious, on edge ***  Not being able to stop or control worrying ***  Worrying too much about different things ***  Trouble relaxing ***  Being so restless that it's hard to sit still ***  Becoming easily annoyed or irritable ***  Feeling afraid as if something awful might happen ***  GAD-7 Score ***   Interventions:  {Interventions for Progress Notes:23405}  DSM-5 Diagnosis(es): 311 (F32.8) Other Specified Depressive Disorder, Emotional Eating Behaviors and 300.02 (F41.1) Generalized Anxiety Disorder  Treatment Goal & Progress: During the initial appointment with this provider, the following treatment goal was established:  increase coping skills. Julia Ibarra has demonstrated progress in her goal as evidenced by {gbtxprogress:22839}. Julia Ibarra also {gbtxprogress2:22951}.  Plan: The next appointment  will be scheduled in {gbweeks:21758}, which will be {gbtxmodality:23402}. The next session will focus on {Plan for Next Appointment:23400}.

## 2021-02-12 ENCOUNTER — Encounter (INDEPENDENT_AMBULATORY_CARE_PROVIDER_SITE_OTHER): Payer: Self-pay | Admitting: Family Medicine

## 2021-02-12 ENCOUNTER — Other Ambulatory Visit: Payer: Self-pay

## 2021-02-12 ENCOUNTER — Ambulatory Visit (INDEPENDENT_AMBULATORY_CARE_PROVIDER_SITE_OTHER): Payer: No Typology Code available for payment source | Admitting: Family Medicine

## 2021-02-12 VITALS — BP 113/71 | HR 76 | Temp 97.5°F | Ht 65.0 in | Wt 241.0 lb

## 2021-02-12 DIAGNOSIS — E559 Vitamin D deficiency, unspecified: Secondary | ICD-10-CM | POA: Diagnosis not present

## 2021-02-12 DIAGNOSIS — Z9189 Other specified personal risk factors, not elsewhere classified: Secondary | ICD-10-CM | POA: Diagnosis not present

## 2021-02-12 DIAGNOSIS — F39 Unspecified mood [affective] disorder: Secondary | ICD-10-CM

## 2021-02-12 DIAGNOSIS — Z6841 Body Mass Index (BMI) 40.0 and over, adult: Secondary | ICD-10-CM | POA: Diagnosis not present

## 2021-02-13 ENCOUNTER — Encounter: Payer: Self-pay | Admitting: Nurse Practitioner

## 2021-02-13 ENCOUNTER — Telehealth (INDEPENDENT_AMBULATORY_CARE_PROVIDER_SITE_OTHER): Payer: No Typology Code available for payment source | Admitting: Nurse Practitioner

## 2021-02-13 ENCOUNTER — Other Ambulatory Visit (HOSPITAL_COMMUNITY): Payer: Self-pay

## 2021-02-13 VITALS — BP 113/73 | Temp 97.3°F | Ht 65.0 in | Wt 241.0 lb

## 2021-02-13 DIAGNOSIS — F324 Major depressive disorder, single episode, in partial remission: Secondary | ICD-10-CM | POA: Diagnosis not present

## 2021-02-13 MED ORDER — BUPROPION HCL ER (SR) 100 MG PO TB12
ORAL_TABLET | Freq: Two times a day (BID) | ORAL | 3 refills | Status: AC
  Filled 2021-02-13: qty 180, 90d supply, fill #0

## 2021-02-13 NOTE — Progress Notes (Signed)
Virtual Visit via Video Note  I connected with@ on 02/13/21 at  2:00 PM EDT by a video enabled telemedicine application and verified that I am speaking with the correct person using two identifiers.  Location: Patient:Home Provider: Office Participants: patient and provider  I discussed the limitations of evaluation and management by telemedicine and the availability of in person appointments. I also discussed with the patient that there may be a patient responsible charge related to this service. The patient expressed understanding and agreed to proceed.  CC:f/up anxiety and depression  History of Present Illness: Depression, major, single episode, complete remission ([place]) Improved mood with wellbutrin No adverse side effect. F/up in 37months Med refill sent.  Depression screen [facility] 2/9 02/13/2021 01/18/2021 12/18/2020  Decreased Interest 0 0 2  Down, Depressed, Hopeless 0 1 3  PHQ - 2 Score 0 1 5  Altered sleeping 0 0 0  Tired, decreased energy 1 2 3   Change in appetite 0 0 1  Feeling bad or failure about yourself  1 1 2   Trouble concentrating 0 0 0  Moving slowly or fidgety/restless 0 0 0  Suicidal thoughts 0 0 0  PHQ-9 Score 2 4 11   Difficult doing work/chores Somewhat difficult Somewhat difficult Somewhat difficult   GAD 7 : Generalized Anxiety Score 02/13/2021 01/18/2021 12/18/2020  Nervous, Anxious, on Edge 1 1 2   Control/stop worrying 1 1 2   Worry too much - different things 1 1 2   Trouble relaxing 1 1 1   Restless 0 0 0  Easily annoyed or irritable 1 1 2   Afraid - awful might happen 1 2 2   Total GAD 7 Score 6 7 11   Anxiety Difficulty Somewhat difficult Somewhat difficult Somewhat difficult   Observations/Objective: Physical Exam Vitals reviewed.  Neurological:     Mental Status: She is alert.  Psychiatric:        Mood and Affect: Mood normal.        Behavior: Behavior normal.        Thought Content: Thought content normal.        Judgment: Judgment normal.     Assessment and Plan: Raileigh was seen today for follow-up.  Diagnoses and all orders for this visit:  Major depressive disorder with single episode, in partial remission (HCC) -     buPROPion (WELLBUTRIN SR) 100 MG 12 hr tablet; TAKE 1 TABLET BY MOUTH 2 TIMES DAILY.   Follow Up Instructions: See above   I discussed the assessment and treatment plan with the patient. The patient was provided an opportunity to ask questions and all were answered. The patient agreed with the plan and demonstrated an understanding of the instructions.   The patient was advised to call back or seek an in-person evaluation if the symptoms worsen or if the condition fails to improve as anticipated.  Wilfred Lacy, NP

## 2021-02-13 NOTE — Assessment & Plan Note (Signed)
Improved mood with wellbutrin No adverse side effect. F/up in 8months Med refill sent.

## 2021-02-14 ENCOUNTER — Ambulatory Visit: Payer: No Typology Code available for payment source | Admitting: Psychology

## 2021-02-14 MED ORDER — VITAMIN D (ERGOCALCIFEROL) 1.25 MG (50000 UNIT) PO CAPS
1.0000 | ORAL_CAPSULE | ORAL | 0 refills | Status: AC
  Filled 2021-02-14: qty 4, 28d supply, fill #0

## 2021-02-14 NOTE — Progress Notes (Signed)
Chief Complaint:   OBESITY Belenda is here to discuss her progress with her obesity treatment plan along with follow-up of her obesity related diagnoses.   Today's visit was #: 5 Starting weight: 249 lbs Starting date: 11/16/2020 Today's weight: 241 lbs Today's date: 02/12/2021 Total lbs lost to date: 8 lbs Body mass index is 40.1 kg/m.  Total weight loss percentage to date: -3.21%  Interim History:  ROBINA HAMOR is here for a follow up office visit and she is following the meal plan without concerns or issues.  Patient's meal and food recall appears to be accurate and consistent with what is on the plan.  When on plan, her hunger and cravings are well controlled.    Carely has been more conscientious of eating on plan, keeping the fridge stocked, and meal prepping.  She is walking and exercising more as well.  Current Meal Plan: the Category 3 Plan for 90% of the time.  Current Exercise Plan: Going to the gym for 40 minutes 5 times per week.  Assessment/Plan:   1. Vitamin D deficiency Not at goal. Current vitamin D is 5.1, tested on 11/16/2020. Optimal goal > 50 ng/dL.  She is taking vitamin D 50,000 IU weekly.  Plan: Continue to take prescription Vitamin D @50 ,000 IU every week as prescribed.  Follow-up for routine testing of Vitamin D, at least 2-3 times per year to avoid over-replacement.  2. Mood disorder ([place]), with emotional eating Medication: Wellbutrin 100 mg twice daily.  Tolerating well.  Denies need for dose change.  Mood is stable.  Plan:  Stable.  Controlled.  Continue with Dr. Mallie Mussel.  Continue current dose of Wellbutrin.  Will follow closely.  We discussed mindful eating and handouts were given.  Behavior modification techniques were discussed today to help deal with emotional/non-hunger eating behaviors.  3. At risk for dehydration Rashawnda is at higher than average risk of dehydration.  Miki was given more than 9 minutes of proper hydration counseling  today.  We discussed the signs and symptoms of dehydration, some of which may include muscle cramping, constipation or even orthostatic symptoms.  Counseling on the prevention of dehydration was also provided today.  Peytyn is at risk for dehydration due to weight loss, lifestyle and behavorial habits and possibly due to taking certain medication(s).  She was encouraged to adequately hydrate and monitor fluid status to avoid dehydration as well as weight loss plateaus.  Unless pre-existing renal or cardiopulmonary conditions exist, in which patient was told to limit their fluid intake, I recommended roughly one half of their weight in pounds to be the approximate ounces of non-caloric, non-caffeinated beverages they should drink per day; including more if they are engaging in exercise.  4. Obesity, current BMI 40.2  Course: Lilliauna is currently in the action stage of change. As such, her goal is to continue with weight loss efforts.   Nutrition goals: She has agreed to the Category 3 Plan with protein equivalents.   Exercise goals: As is.  Behavioral modification strategies: increasing lean protein intake, meal planning and cooking strategies, better snacking choices and planning for success.  She does not like Fairlife mild, thus we discussed protein equivalents that she can substitute.   Kathlynn has agreed to follow-up with our clinic in 2-3 weeks. She was informed of the importance of frequent follow-up visits to maximize her success with intensive lifestyle modifications for her multiple health conditions.   Objective:   Blood pressure 113/71, pulse 76,  temperature (!) 97.5 F (36.4 C), height 5\' 5"  (1.651 m), weight 241 lb (109.3 kg), SpO2 98 %, not currently breastfeeding. Body mass index is 40.1 kg/m.  General: Cooperative, alert, well developed, in no acute distress. HEENT: Conjunctivae and lids unremarkable. Cardiovascular: Regular rhythm.  Lungs: Normal work of  breathing. Neurologic: No focal deficits.   Lab Results  Component Value Date   CREATININE 0.88 11/16/2020   BUN 9 11/16/2020   NA 140 11/16/2020   K 4.2 11/16/2020   CL 105 11/16/2020   CO2 23 11/16/2020   Lab Results  Component Value Date   ALT 12 11/16/2020   AST 14 11/16/2020   ALKPHOS 85 11/16/2020   BILITOT 0.3 11/16/2020   Lab Results  Component Value Date   HGBA1C 5.6 11/16/2020   HGBA1C 5.2 12/03/2017   Lab Results  Component Value Date   INSULIN 5.3 11/16/2020   INSULIN 7.6 12/03/2017   Lab Results  Component Value Date   TSH 1.580 11/16/2020   Lab Results  Component Value Date   CHOL 185 11/16/2020   HDL 88 11/16/2020   LDLCALC 87 11/16/2020   TRIG 50 11/16/2020   CHOLHDL 2.1 11/16/2020   Lab Results  Component Value Date   WBC 6.7 11/16/2020   HGB 13.0 11/16/2020   HCT 40.9 11/16/2020   MCV 87 11/16/2020   PLT 328 11/16/2020   Lab Results  Component Value Date   FERRITIN 103 09/22/2019   Attestation Statements:   Reviewed by clinician on day of visit: allergies, medications, problem list, medical history, surgical history, family history, social history, and previous encounter notes.  I, Water quality scientist, CMA, am acting as Location manager for [employer], DO.  I have reviewed the above documentation for accuracy and completeness, and I agree with the above. Marjory Sneddon, D.O.  The [place] was signed into law in 2016 which includes the topic of electronic health records.  This provides immediate access to information in MyChart.  This includes consultation notes, operative notes, office notes, lab results and pathology reports.  If you have any questions about what you read please let us know at your next visit so we can discuss your concerns and take corrective action if need be.  We are right here with you.

## 2021-02-15 ENCOUNTER — Other Ambulatory Visit (HOSPITAL_COMMUNITY): Payer: Self-pay

## 2021-02-17 DIAGNOSIS — Z9189 Other specified personal risk factors, not elsewhere classified: Secondary | ICD-10-CM | POA: Insufficient documentation

## 2021-02-17 DIAGNOSIS — F39 Unspecified mood [affective] disorder: Secondary | ICD-10-CM | POA: Insufficient documentation

## 2021-02-17 DIAGNOSIS — Z6841 Body Mass Index (BMI) 40.0 and over, adult: Secondary | ICD-10-CM | POA: Insufficient documentation

## 2021-02-20 ENCOUNTER — Telehealth (INDEPENDENT_AMBULATORY_CARE_PROVIDER_SITE_OTHER): Payer: No Typology Code available for payment source | Admitting: Psychology

## 2021-02-26 ENCOUNTER — Ambulatory Visit (INDEPENDENT_AMBULATORY_CARE_PROVIDER_SITE_OTHER): Payer: No Typology Code available for payment source | Admitting: Family Medicine

## 2021-02-27 ENCOUNTER — Encounter (INDEPENDENT_AMBULATORY_CARE_PROVIDER_SITE_OTHER): Payer: Self-pay

## 2021-02-28 ENCOUNTER — Ambulatory Visit (INDEPENDENT_AMBULATORY_CARE_PROVIDER_SITE_OTHER): Payer: No Typology Code available for payment source | Admitting: Psychology

## 2021-02-28 DIAGNOSIS — F4323 Adjustment disorder with mixed anxiety and depressed mood: Secondary | ICD-10-CM | POA: Diagnosis not present

## 2021-03-12 ENCOUNTER — Telehealth (INDEPENDENT_AMBULATORY_CARE_PROVIDER_SITE_OTHER): Payer: Self-pay | Admitting: Psychology

## 2021-03-12 NOTE — Telephone Encounter (Signed)
  Office: [phone]  /  Fax: [phone]  Date of Call: Mar 12, 2021  Time of Call: 11:19am Provider: Glennie Isle, PsyD  CONTENT:  This provider called Lorelle to check-in and schedule a follow-up appointment. A HIPAA compliant voicemail was left requesting a call back.   PLAN:  This provider will wait for Khia to call back. No further follow-up planned by this provider.

## 2021-03-14 ENCOUNTER — Ambulatory Visit: Payer: No Typology Code available for payment source | Admitting: Psychology

## 2021-03-28 ENCOUNTER — Ambulatory Visit (INDEPENDENT_AMBULATORY_CARE_PROVIDER_SITE_OTHER): Payer: No Typology Code available for payment source | Admitting: Psychology

## 2021-03-28 DIAGNOSIS — F4323 Adjustment disorder with mixed anxiety and depressed mood: Secondary | ICD-10-CM | POA: Diagnosis not present

## 2021-04-02 ENCOUNTER — Other Ambulatory Visit (HOSPITAL_COMMUNITY): Payer: Self-pay

## 2021-07-03 ENCOUNTER — Encounter: Payer: No Typology Code available for payment source | Admitting: Nurse Practitioner

## 2022-01-08 ENCOUNTER — Other Ambulatory Visit: Payer: Self-pay

## 2022-01-08 DIAGNOSIS — K769 Liver disease, unspecified: Secondary | ICD-10-CM

## 2022-01-08 DIAGNOSIS — R7989 Other specified abnormal findings of blood chemistry: Secondary | ICD-10-CM

## 2022-01-27 IMAGING — MR MR ABDOMEN WO/W CM
18 of 19 series · 47 of 48 positions shown · IV contrast (gadavist)
Comparison: MRI abdomen dated 06/14/2020

CLINICAL DATA: Elevated LFTs, multiple liver lesions on prior

EXAM:
MRI ABDOMEN WITHOUT AND WITH CONTRAST
TECHNIQUE: Multiplanar multisequence MR imaging of the abdomen was performed
both before and after the administration of intravenous contrast.
CONTRAST:  10mL GADAVIST GADOBUTROL 1 MMOL/ML IV SOLN

[Series 3: T2 · coronal · 6.0mm · 1.95mm/px · 2 of 33 slices shown (1 of 2)]
[im 1/33]
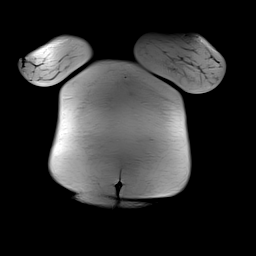
[im 33/33]
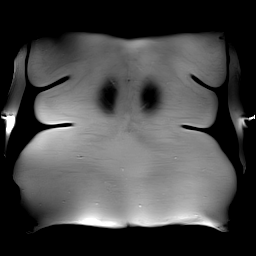

[Series 4: T2 fat-sat · axial · 6.0mm · 1.56mm/px · z∈[-133,+148]mm · 2 of 40 slices shown]
[im 1/40]
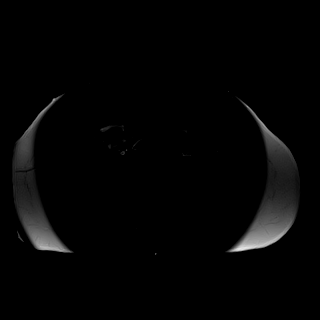
[im 40/40]
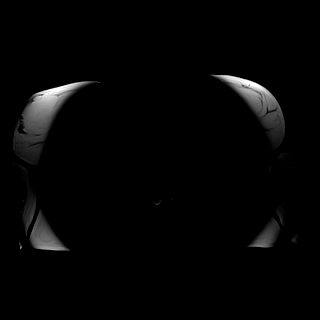

[Series 6: T1 · axial · 3.0mm · 1.56mm/px · z∈[-146,+115]mm · 4 of 88 slices shown (1 of 2)]
[im 1/88]
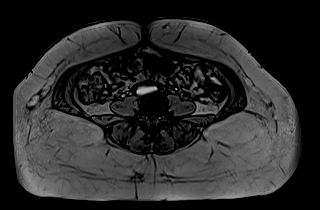
[im 30/88]
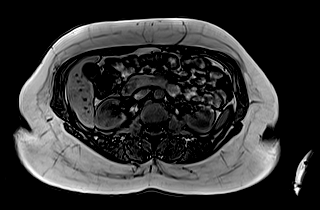
[im 59/88]
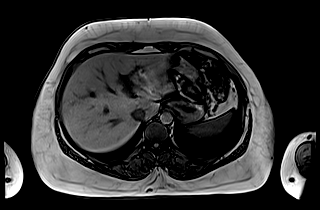
[im 88/88]
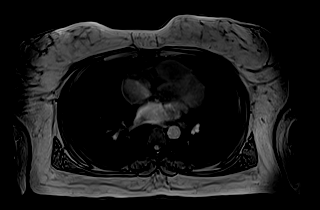

[Series 7: T1 · axial · 3.0mm · 1.56mm/px · z∈[-146,+115]mm · 3 of 88 slices shown (2 of 2)]
[im 1/88]
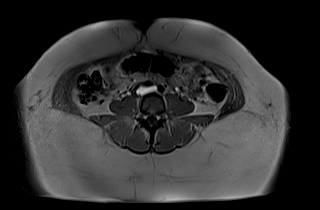
[im 44/88]
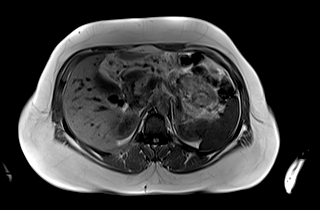
[im 88/88]
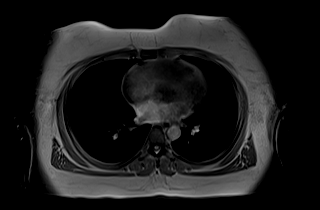

[Series 8: DWI · axial · 6.0mm · 1.84mm/px · z∈[-156,+125]mm · 3 of 80 slices shown (1 of 2)]
[im 1/80]
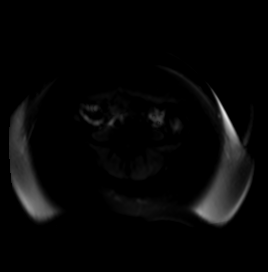
[im 40/80]
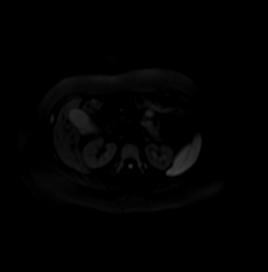
[im 80/80]
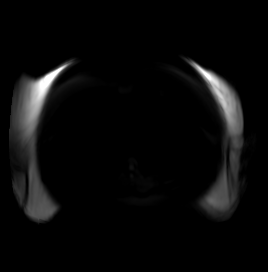

[Series 9: DWI · axial · 6.0mm · 1.84mm/px · 1 of 40 slices shown (2 of 2)]
[im 1/40]
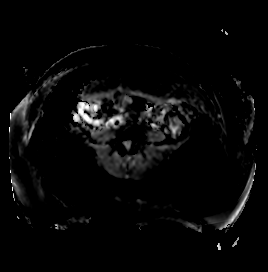

[Series 10: bSSFP · axial · 6.0mm · 0.98mm/px · 1 of 40 slices shown]
[im 1/40]
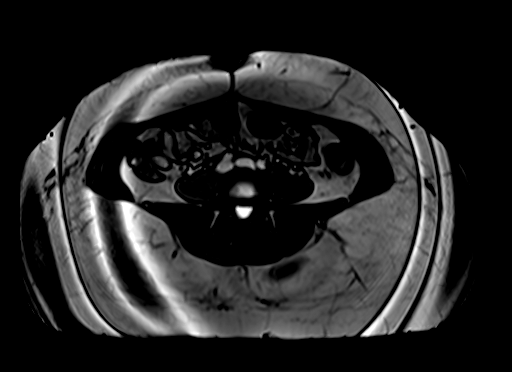

[Series 12: T1 dynamic · axial · 3.0mm · 1.56mm/px · z∈[-158,+127]mm · 3 of 96 slices shown (1 of 10)]
[im 1/96]
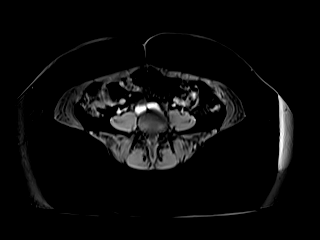
[im 48/96]
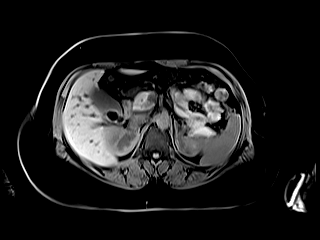
[im 96/96]
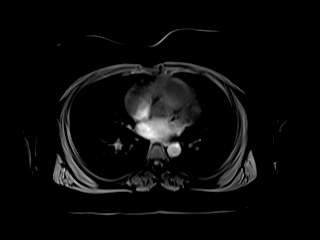

[Series 16: T1 dynamic · axial · 3.0mm · 1.56mm/px · z∈[-158,+127]mm · 3 of 96 slices shown (2 of 10)]
[im 1/96]
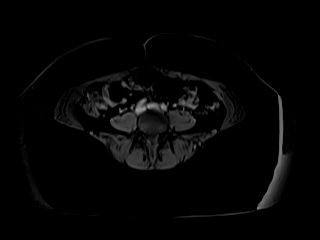
[im 48/96]
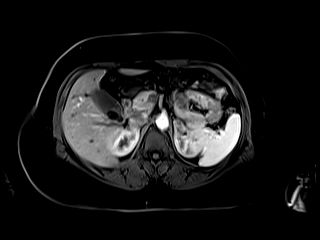
[im 96/96]
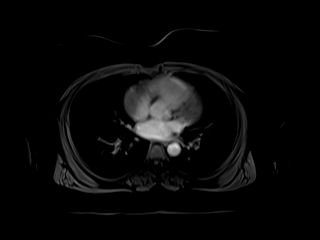

[Series 17: T1 dynamic · axial · 3.0mm · 1.56mm/px · z∈[-158,+127]mm · 3 of 96 slices shown (3 of 10)]
[im 1/96]
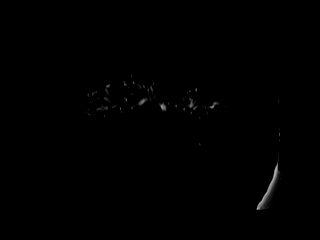
[im 48/96]
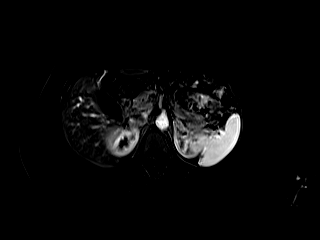
[im 96/96]
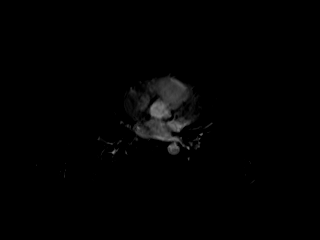

[Series 20: T1 dynamic · axial · 3.0mm · 1.56mm/px · z∈[-158,+127]mm · 3 of 96 slices shown (4 of 10)]
[im 1/96]
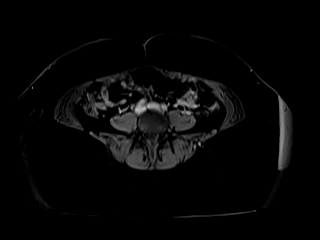
[im 48/96]
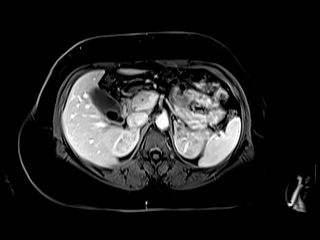
[im 96/96]
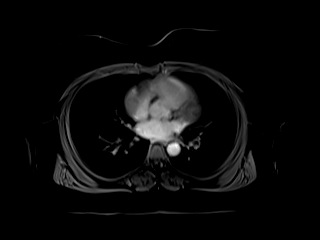

[Series 21: T1 dynamic · axial · 3.0mm · 1.56mm/px · z∈[-158,+127]mm · 3 of 96 slices shown (5 of 10)]
[im 1/96]
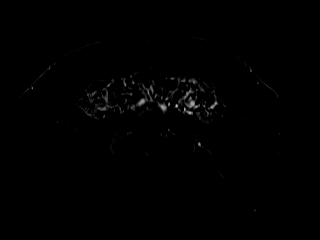
[im 48/96]
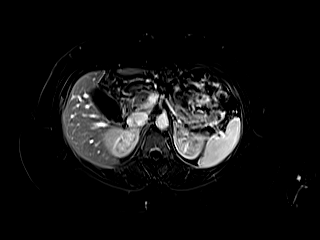
[im 96/96]
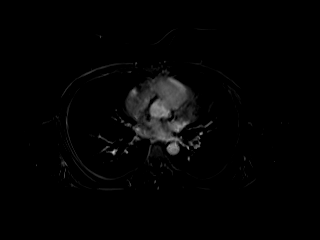

[Series 24: T1 dynamic · axial · 3.0mm · 1.56mm/px · z∈[-158,+127]mm · 3 of 96 slices shown (6 of 10)]
[im 1/96]
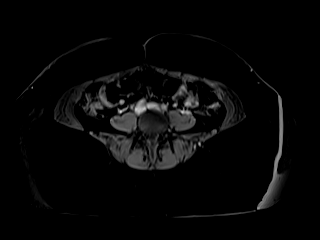
[im 48/96]
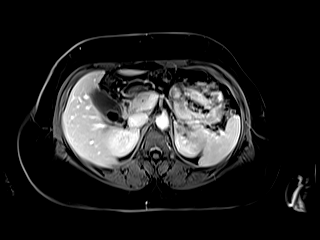
[im 96/96]
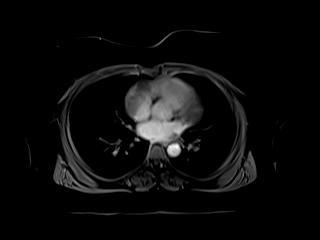

[Series 25: T1 dynamic · axial · 3.0mm · 1.56mm/px · z∈[-158,+127]mm · 3 of 96 slices shown (7 of 10)]
[im 1/96]
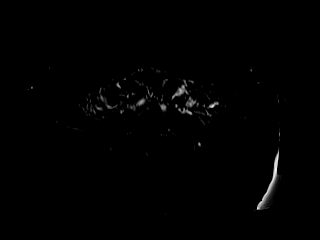
[im 48/96]
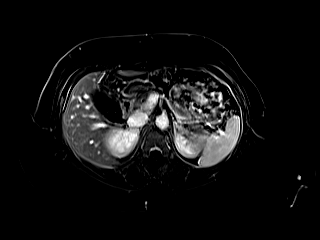
[im 96/96]
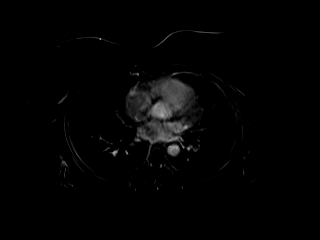

[Series 27: T1 dynamic · coronal · 5.0mm · 1.56mm/px · 3 of 80 slices shown (8 of 10)]
[im 1/80]
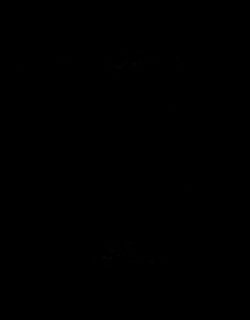
[im 40/80]
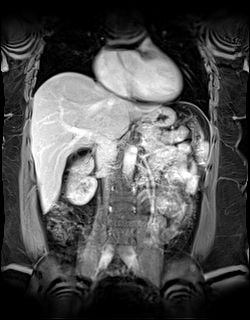
[im 80/80]
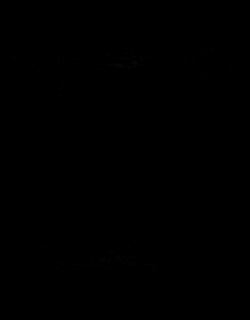

[Series 28: T2 · axial · 6.0mm · 1.95mm/px · 1 of 41 slices shown (2 of 2)]
[im 1/41]
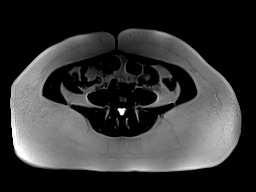

[Series 31: T1 dynamic · axial · 3.0mm · 1.56mm/px · z∈[-158,+127]mm · 3 of 96 slices shown (9 of 10)]
[im 1/96]
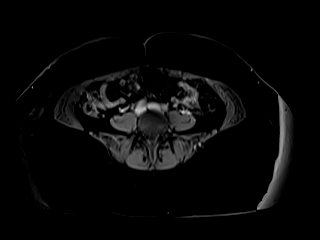
[im 48/96]
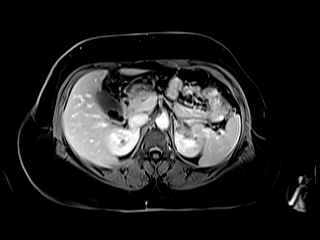
[im 96/96]
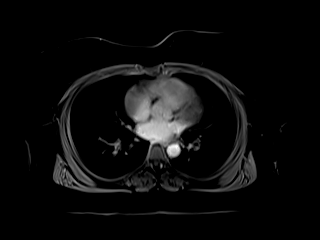

[Series 32: T1 dynamic · axial · 3.0mm · 1.56mm/px · z∈[-158,+127]mm · 3 of 96 slices shown (10 of 10)]
[im 1/96]
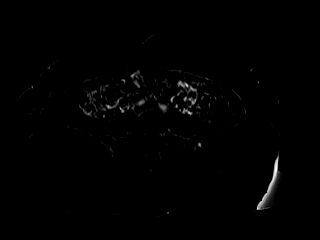
[im 48/96]
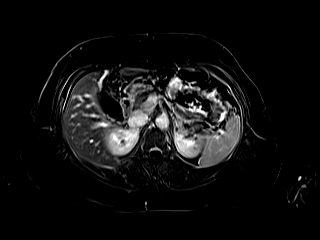
[im 96/96]
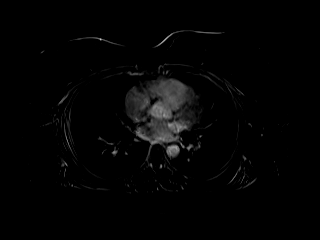

[47 of 48 positions shown; findings below may reference images not displayed]

FINDINGS: Lower chest: Lung bases are clear.

Hepatobiliary: 2.6 x 2.0 cm lesion in segment 7 (series 4/image 18),
previously 2.5 x 2.0 cm when measured in a similar fashion. On the
current MR, it demonstrates peripheral nodular continuous
enhancement (not discontinuous), and is therefore atypical for a
benign hemangioma (series 16/image 41). However, this demonstrates
the classic hyperechoic appearance on prior ultrasound, is lobular
and bright on T2, is stable from the prior, and demonstrates central
wash-in on later phases (series 24/image 40), all of which are
typical. Overall, this remains compatible with a benign hepatic
hemangioma.

Again noted are subtle areas of restricted diffusion in the
posterior right hepatic dome (series 8/images 9 and 10), as well as
the posterior right hepatic lobe (series 8/image 13). At least one
of these sites in the posterior right hepatic dome correspond to a
small focus of early arterial enhancement on subtraction imaging
(series 17/image 30).

Again noted is a subtle area of restricted diffusion in the lateral
segment left hepatic lobe (series 8/image 13), as well as an
additional area in segment 4 (series 8/image 14). Associated vague
early arterial enhancement on subtraction imaging (series 17/images
35 and 38), although equivocal.

Overall, these lesions are indeterminate, but in the absence of
known malignancy, favor benign FNH or hemangiomas.

No hepatic steatosis.

Layering gallstones measuring up to 18 mm (series 4/image 22),
without associated inflammatory changes. No intrahepatic or
extrahepatic ductal dilatation.

Pancreas:  Within normal limits.

Spleen:  Within normal limits.

Adrenals/Urinary Tract:  Adrenal glands are within normal limits.

Kidneys are within normal limits.  No hydronephrosis.

Stomach/Bowel: Stomach is within normal limits.

Visualized bowel is grossly unremarkable.

Vascular/Lymphatic:  No evidence of abdominal aortic aneurysm.

No suspicious abdominal lymphadenopathy.

Other:  No abdominal ascites.

Musculoskeletal: No focal osseous lesions.
IMPRESSION: Multiple stable hepatic lesions, as described above.

Dominant 2.6 cm lesion in segment 7 favors a benign hemangioma.

Additional scattered tiny lesions are best visualized on diffusion
imaging and favor benign FNH or hemangiomas. Consider follow-up MR
in 1 year.

## 2022-05-29 ENCOUNTER — Encounter (INDEPENDENT_AMBULATORY_CARE_PROVIDER_SITE_OTHER): Payer: Self-pay

## 2023-08-21 ENCOUNTER — Telehealth: Payer: Self-pay | Admitting: Nurse Practitioner

## 2023-08-21 NOTE — Telephone Encounter (Signed)
Lvmtcb to schedule appt
